# Patient Record
Sex: Male | Born: 1965
Health system: Southern US, Community
[De-identification: ages and names within clinical notes are randomized; demographics above are authoritative.]

## PROBLEM LIST (undated history)

## (undated) DIAGNOSIS — E271 Primary adrenocortical insufficiency: Secondary | ICD-10-CM

## (undated) DIAGNOSIS — E785 Hyperlipidemia, unspecified: Secondary | ICD-10-CM

## (undated) HISTORY — DX: Hyperlipidemia, unspecified: E78.5

## (undated) HISTORY — DX: Primary adrenocortical insufficiency: E27.1

## (undated) HISTORY — PX: HERNIA REPAIR: SHX51

---

## 2004-05-04 ENCOUNTER — Ambulatory Visit (HOSPITAL_COMMUNITY): Admission: RE | Admit: 2004-05-04 | Discharge: 2004-05-04 | Payer: Self-pay | Admitting: Family Medicine

## 2015-08-14 DIAGNOSIS — D582 Other hemoglobinopathies: Secondary | ICD-10-CM | POA: Diagnosis not present

## 2016-06-03 DIAGNOSIS — Z1389 Encounter for screening for other disorder: Secondary | ICD-10-CM | POA: Diagnosis not present

## 2016-06-03 DIAGNOSIS — R7309 Other abnormal glucose: Secondary | ICD-10-CM | POA: Diagnosis not present

## 2016-06-03 DIAGNOSIS — E039 Hypothyroidism, unspecified: Secondary | ICD-10-CM | POA: Diagnosis not present

## 2016-06-03 DIAGNOSIS — R030 Elevated blood-pressure reading, without diagnosis of hypertension: Secondary | ICD-10-CM | POA: Diagnosis not present

## 2016-06-03 DIAGNOSIS — E782 Mixed hyperlipidemia: Secondary | ICD-10-CM | POA: Diagnosis not present

## 2016-06-08 ENCOUNTER — Encounter (INDEPENDENT_AMBULATORY_CARE_PROVIDER_SITE_OTHER): Payer: Self-pay | Admitting: *Deleted

## 2016-08-03 DIAGNOSIS — B349 Viral infection, unspecified: Secondary | ICD-10-CM | POA: Diagnosis not present

## 2016-08-03 DIAGNOSIS — K529 Noninfective gastroenteritis and colitis, unspecified: Secondary | ICD-10-CM | POA: Diagnosis not present

## 2016-08-03 DIAGNOSIS — B9789 Other viral agents as the cause of diseases classified elsewhere: Secondary | ICD-10-CM | POA: Diagnosis not present

## 2016-08-03 DIAGNOSIS — Z6841 Body Mass Index (BMI) 40.0 and over, adult: Secondary | ICD-10-CM | POA: Diagnosis not present

## 2016-09-30 DIAGNOSIS — E039 Hypothyroidism, unspecified: Secondary | ICD-10-CM | POA: Diagnosis not present

## 2017-07-07 DIAGNOSIS — E271 Primary adrenocortical insufficiency: Secondary | ICD-10-CM | POA: Diagnosis not present

## 2017-07-07 DIAGNOSIS — R7309 Other abnormal glucose: Secondary | ICD-10-CM | POA: Diagnosis not present

## 2017-07-07 DIAGNOSIS — Z1389 Encounter for screening for other disorder: Secondary | ICD-10-CM | POA: Diagnosis not present

## 2017-07-07 DIAGNOSIS — Z6838 Body mass index (BMI) 38.0-38.9, adult: Secondary | ICD-10-CM | POA: Diagnosis not present

## 2017-07-07 DIAGNOSIS — R030 Elevated blood-pressure reading, without diagnosis of hypertension: Secondary | ICD-10-CM | POA: Diagnosis not present

## 2017-07-07 DIAGNOSIS — E039 Hypothyroidism, unspecified: Secondary | ICD-10-CM | POA: Diagnosis not present

## 2017-07-07 DIAGNOSIS — E782 Mixed hyperlipidemia: Secondary | ICD-10-CM | POA: Diagnosis not present

## 2017-07-07 LAB — LIPID PANEL
Cholesterol: 225 — AB (ref 0–200)
HDL: 56 (ref 35–70)
LDL Cholesterol: 118
Triglycerides: 253 — AB (ref 40–160)

## 2017-07-07 LAB — HEMOGLOBIN A1C: HEMOGLOBIN A1C: 11.8

## 2017-07-07 LAB — BASIC METABOLIC PANEL
BUN: 11 (ref 4–21)
CREATININE: 0.7 (ref <=1.3)

## 2017-07-07 LAB — PSA: PSA: 1.1

## 2017-07-07 LAB — TSH: TSH: 1.7 (ref <=5.90)

## 2017-07-12 ENCOUNTER — Encounter (INDEPENDENT_AMBULATORY_CARE_PROVIDER_SITE_OTHER): Payer: Self-pay | Admitting: *Deleted

## 2017-08-24 ENCOUNTER — Ambulatory Visit: Payer: BLUE CROSS/BLUE SHIELD | Admitting: "Endocrinology

## 2017-08-24 ENCOUNTER — Other Ambulatory Visit: Payer: Self-pay

## 2017-08-24 ENCOUNTER — Encounter: Payer: Self-pay | Admitting: "Endocrinology

## 2017-08-24 VITALS — BP 148/94 | HR 77 | Ht 70.0 in | Wt 264.0 lb

## 2017-08-24 DIAGNOSIS — F17200 Nicotine dependence, unspecified, uncomplicated: Secondary | ICD-10-CM | POA: Diagnosis not present

## 2017-08-24 DIAGNOSIS — E2740 Unspecified adrenocortical insufficiency: Secondary | ICD-10-CM | POA: Diagnosis not present

## 2017-08-24 DIAGNOSIS — E1165 Type 2 diabetes mellitus with hyperglycemia: Secondary | ICD-10-CM | POA: Diagnosis not present

## 2017-08-24 DIAGNOSIS — I10 Essential (primary) hypertension: Secondary | ICD-10-CM | POA: Insufficient documentation

## 2017-08-24 DIAGNOSIS — E782 Mixed hyperlipidemia: Secondary | ICD-10-CM | POA: Diagnosis not present

## 2017-08-24 DIAGNOSIS — E039 Hypothyroidism, unspecified: Secondary | ICD-10-CM | POA: Insufficient documentation

## 2017-08-24 MED ORDER — GLUCOSE BLOOD VI STRP: ORAL_STRIP | 2 refills | Status: DC

## 2017-08-24 MED ORDER — ONETOUCH VERIO W/DEVICE KIT: 1.0000 | PACK | 0 refills | Status: DC | PRN

## 2017-08-24 MED ORDER — LANCETS ULTRA FINE MISC: 1.0000 | Freq: Four times a day (QID) | 2 refills | Status: DC

## 2017-08-24 MED ORDER — ONETOUCH VERIO W/DEVICE KIT: 1.0000 | PACK | Freq: Four times a day (QID) | 0 refills | Status: DC

## 2017-08-24 MED ORDER — METFORMIN HCL 500 MG PO TABS
500.0000 mg | ORAL_TABLET | Freq: Two times a day (BID) | ORAL | 2 refills | Status: DC
Start: 2017-08-24 — End: 2017-09-04

## 2017-08-24 NOTE — Progress Notes (Signed)
Endocrinology Consult Note       08/24/2017, 8:30 PM   Subjective:    Patient ID: Jeffery Daniels, male    DOB: [birth year].  Jeffery Daniels is being seen in consultation for management of currently uncontrolled symptomatic diabetes requested by  Jeffery Sites, MD.   Past Medical History:  Diagnosis Date  . Addison disease ([place])   . Hyperlipidemia    Past Surgical History:  Procedure Laterality Date  . HERNIA REPAIR     Social History   Socioeconomic History  . Marital status: Single    Spouse name: Not on file  . Number of children: Not on file  . Years of education: Not on file  . Highest education level: Not on file  Occupational History  . Not on file  Social Needs  . Financial resource strain: Not on file  . Food insecurity:    Worry: Not on file    Inability: Not on file  . Transportation needs:    Medical: Not on file    Non-medical: Not on file  Tobacco Use  . Smoking status: Current Every Day Smoker    Packs/day: 1.50    Years: 25.00    Pack years: 37.50    Types: Cigarettes  . Smokeless tobacco: Never Used  Substance and Sexual Activity  . Alcohol use: Yes    Comment: Occasional  . Drug use: Never  . Sexual activity: Never  Lifestyle  . Physical activity:    Days per week: Not on file    Minutes per session: Not on file  . Stress: Not on file  Relationships  . Social connections:    Talks on phone: Not on file    Gets together: Not on file    Attends religious service: Not on file    Active member of club or organization: Not on file    Attends meetings of clubs or organizations: Not on file    Relationship status: Not on file  Other Topics Concern  . Not on file  Social History Narrative  . Not on file   Outpatient Encounter Medications as of 08/24/2017  Medication Sig  . hydrocortisone (CORTEF) 20 MG tablet Take 20 mg by mouth 2 (two) times daily.  .  simvastatin (ZOCOR) 20 MG tablet Take 20 mg by mouth daily.  . fludrocortisone (FLORINEF) 0.1 MG tablet Take 100 mcg by mouth daily.  Marland Kitchen levothyroxine (SYNTHROID, LEVOTHROID) 200 MCG tablet daily.  Marland Kitchen levothyroxine (SYNTHROID, LEVOTHROID) 25 MCG tablet daily.  . metFORMIN (GLUCOPHAGE) 500 MG tablet Take 1 tablet (500 mg total) by mouth 2 (two) times daily with a meal.  . [DISCONTINUED] Blood Glucose Monitoring Suppl (ONETOUCH VERIO) w/Device KIT 1 each by Does not apply route as needed.  . [DISCONTINUED] glucose blood (ONETOUCH VERIO) test strip Use as instructed   No facility-administered encounter medications on file as of 08/24/2017.     ALLERGIES: No Known Allergies  VACCINATION STATUS:  There is no immunization history on file for this patient.  Diabetes  He presents for his initial diabetic visit. He has type 2 diabetes mellitus. Onset time: He diagnosed  at age of 37 years. His disease course has been worsening. There are no hypoglycemic associated symptoms. Pertinent negatives for hypoglycemia include no headaches, seizures or tremors. Associated symptoms include blurred vision, polydipsia, polyuria and weight loss. Pertinent negatives for diabetes include no chest pain. There are no hypoglycemic complications. Symptoms are worsening. There are no diabetic complications. Risk factors for coronary artery disease include diabetes mellitus, dyslipidemia, male sex, obesity, hypertension, sedentary lifestyle, tobacco exposure and family history. When asked about current treatments, none were reported. His weight is decreasing steadily. He is following a generally unhealthy diet. When asked about meal planning, he reported none. He has not had a previous visit with a dietitian. He rarely participates in exercise. (He is not monitoring BG, his recent a1c was 11.8%.) An ACE inhibitor/angiotensin II receptor blocker is not being taken. He does not see a podiatrist.Eye exam is not current.   Hyperlipidemia  This is a chronic problem. The current episode started more than 1 year ago. The problem is uncontrolled. Exacerbating diseases include diabetes, hypothyroidism and obesity. Factors aggravating his hyperlipidemia include smoking. Pertinent negatives include no chest pain, myalgias or shortness of breath. Risk factors for coronary artery disease include dyslipidemia, diabetes mellitus, family history, obesity, male sex, hypertension and a sedentary lifestyle.  Hypertension  This is a chronic problem. The current episode started more than 1 year ago. The problem is unchanged. Associated symptoms include blurred vision. Pertinent negatives include no chest pain, headaches, palpitations or shortness of breath. Risk factors for coronary artery disease include diabetes mellitus, dyslipidemia, family history, obesity, male gender, sedentary lifestyle and smoking/tobacco exposure. Past treatments include nothing.      Review of Systems  Constitutional: Positive for weight loss. Negative for chills and fever.  Eyes: Positive for blurred vision.  Respiratory: Negative for cough and shortness of breath.   Cardiovascular: Negative for chest pain and palpitations.       No Shortness of breath  Gastrointestinal: Negative for abdominal pain, diarrhea, nausea and vomiting.  Endocrine: Positive for polydipsia and polyuria.  Genitourinary: Negative for frequency, hematuria and urgency.  Musculoskeletal: Negative for myalgias.  Skin: Negative for rash.  Neurological: Negative for tremors, seizures and headaches.  Hematological: Does not bruise/bleed easily.  Psychiatric/Behavioral: Negative for hallucinations and suicidal ideas.    Objective:    BP (!) 148/94   Pulse 77   Ht _0  (1.778 m)   Wt 264 lb (119.7 kg)   BMI 37.88 kg/m   Wt Readings from Last 3 Encounters:  08/24/17 264 lb (119.7 kg)     Physical Exam  Constitutional: He is oriented to person, place, and time. He  appears well-developed. He is cooperative. No distress.  HENT:  Head: Normocephalic and atraumatic.  Eyes: EOM are normal.  Neck: Normal range of motion. Neck supple. No tracheal deviation present. No thyromegaly present.  Cardiovascular: Normal rate, S1 normal, S2 normal and normal heart sounds. Exam reveals no gallop.  No murmur heard. Pulses:      Dorsalis pedis pulses are 1+ on the right side, and 1+ on the left side.       Posterior tibial pulses are 1+ on the right side, and 1+ on the left side.  Pulmonary/Chest: Breath sounds normal. No respiratory distress. He has no wheezes.  Abdominal: Soft. Bowel sounds are normal. He exhibits no distension. There is no tenderness. There is no guarding and no CVA tenderness.  Musculoskeletal: He exhibits no edema.       Right shoulder:  He exhibits no swelling and no deformity.  Neurological: He is alert and oriented to person, place, and time. He has normal strength and normal reflexes. No cranial nerve deficit or sensory deficit. Gait normal.  Skin: Skin is warm and dry. No rash noted. No cyanosis. Nails show no clubbing.  Psychiatric: He has a normal mood and affect. His speech is normal and behavior is normal. Judgment and thought content normal. Cognition and memory are normal.    Recent Results (from the past 2160 hour(s))  Hemoglobin A1c     Status: None   Collection Time: 07/07/17 12:00 AM  Result Value Ref Range   Hemoglobin A1C 71.2   Basic metabolic panel     Status: None   Collection Time: 07/07/17 12:00 AM  Result Value Ref Range   BUN 11 4 - 21   Creatinine 0.7 0.6 - 1.3  Lipid panel     Status: Abnormal   Collection Time: 07/07/17 12:00 AM  Result Value Ref Range   Triglycerides 253 (A) 40 - 160   Cholesterol 225 (A) 0 - 200   HDL 56 35 - 70   LDL Cholesterol 118   PSA     Status: None   Collection Time: 07/07/17 12:00 AM  Result Value Ref Range   PSA 1.1   TSH     Status: None   Collection Time: 07/07/17 12:00 AM   Result Value Ref Range   TSH 1.70 0.41 - 5.90        Assessment & Plan:   1. Uncontrolled type 2 diabetes mellitus with hyperglycemia   - Jeffery Daniels has currently uncontrolled symptomatic type 2 DM since  52 years of age,  with most recent A1c of 11.8%, not yet started on treatment. Recent labs reviewed.  -his diabetes is complicated by adrenal insufficiency on hydrocortisone, hypothyroidism, obesity/sedentary life, chronic heavy smoking and Jeffery Daniels remains at a high risk for more acute and chronic complications which include CAD, CVA, CKD, retinopathy, and neuropathy. These are all discussed in detail with the patient.  - I have counseled him on diet management and weight loss, by adopting a carbohydrate restricted/protein rich diet.  - Suggestion is made for him to avoid simple carbohydrates  from his diet including Cakes, Sweet Desserts, Ice Cream, Soda (diet and regular), Sweet Tea, Candies, Chips, Cookies, Store Bought Juices, Alcohol in Excess of  1-2 drinks a day, Artificial Sweeteners, and "Sugar-free" Products. This will help patient to have stable blood glucose profile and potentially avoid unintended weight gain.  - I encouraged him to switch to  unprocessed or minimally processed complex starch and increased protein intake (animal or plant source), fruits, and vegetables.  - he is advised to stick to a routine mealtimes to eat 3 meals  a day and avoid unnecessary snacks ( to snack only to correct hypoglycemia).   - he will be scheduled with Jeffery Daniels, RDN, CDE for individualized diabetes education.  - I have approached him with the following individualized plan to manage diabetes and patient agrees:   - He is treatment naive , will start monitoring of glucose 4 times a day-before meals and at bedtime, and return in 1 week with his meter and logs. - He may need insulin therapy based on his BG profile.  - I will continue start metformin 554m po BID,   therapeutically suitable for patient .  - he will be considered for incretin therapy as appropriate next visit. - Patient  specific target  A1c;  LDL, HDL, Triglycerides, and  Waist Circumference were discussed in detail.  2) BP/HTN: Uncontrolled, he is not on treatment. He will be considered for low dose Lisinopril next visit.  3) Lipids/HPL: Uncontrolled with LDL of 118. He is advised to continue Simvastatin 20 mg po qhs. Side effects are discussed with him.  4)  Weight/Diet: CDE Consult will be initiated , exercise, and detailed carbohydrates information provided.  5. Adrenal insufficiency ([place]) - The circumstances of his diagnosis 25 years ago are not available available to review. He reports Addison's Disease. He will be considered for work up with anti 21 hydroxylase antibody on subsequent visits. In the mean time , he is advised to continue on Hydrocortisone 48m po BID, fludrocortisone 0.175mpo qday. He may benefit from lower dose of hydrocortisone. Will lower to 30 mg po qday next visit.  6. Hypothyroidism, unspecified type - He took thyroid hormone for 10+ years , currently 225 mcg po qam. His recent TSH was on target 1.7. He is advised to continue . We discussed about the correct was to take his thyroid hormone daily on empty stomach half an hour before food or other medications.   7) Chronic Care/Health Maintenance:  -he  is not on  ACEI/ARB, is on  Statin medications and  is encouraged to continue to follow up with Ophthalmology, Dentist,  Podiatrist at least yearly or according to recommendations, and advised to  Quit smoking. I have recommended yearly flu vaccine and pneumonia vaccination at least every 5 years; moderate intensity exercise for up to 150 minutes weekly; and  sleep for at least 7 hours a day.  - I advised patient to maintain close follow up with Jeffery Daniels for primary care needs.  - Time spent with the patient: 45 minutes, of which >50% was spent in  obtaining information about his symptoms, reviewing his previous labs, evaluations, and treatments, counseling him about his  Currently uncontrolled type 2 DM, hypothyroidism, adrenal insufficiency , HTN, HPL , chronic heavy smoking,  and developing a plan to confirm the diagnosis and long term treatment as necessary.  PaKathrin Pennerarticipated in the discussions, expressed understanding, and voiced agreement with the above plans.  All questions were answered to his satisfaction. he is encouraged to contact clinic should he have any questions or concerns prior to his return visit.  Follow up plan: - Return in about 1 week (around 08/31/2017) for follow up with meter and logs- no labs.  GeGlade LloydMD CoBon[facility] Hospitalroup ReH[facility] Hospital[address] StreeteS[place] [address]hone: [phone]: [phone]  08/24/2017, 8:30 PM  This note was partially dictated with voice recognition software. Similar sounding words can be transcribed inadequately or may not  be corrected upon review.

## 2017-08-24 NOTE — Patient Instructions (Signed)

## 2017-09-04 ENCOUNTER — Encounter: Payer: Self-pay | Admitting: "Endocrinology

## 2017-09-04 ENCOUNTER — Ambulatory Visit (INDEPENDENT_AMBULATORY_CARE_PROVIDER_SITE_OTHER): Payer: BLUE CROSS/BLUE SHIELD | Admitting: "Endocrinology

## 2017-09-04 VITALS — BP 150/84 | HR 74 | Ht 70.0 in | Wt 270.0 lb

## 2017-09-04 DIAGNOSIS — E2740 Unspecified adrenocortical insufficiency: Secondary | ICD-10-CM

## 2017-09-04 DIAGNOSIS — E039 Hypothyroidism, unspecified: Secondary | ICD-10-CM | POA: Diagnosis not present

## 2017-09-04 DIAGNOSIS — E782 Mixed hyperlipidemia: Secondary | ICD-10-CM | POA: Diagnosis not present

## 2017-09-04 DIAGNOSIS — E1165 Type 2 diabetes mellitus with hyperglycemia: Secondary | ICD-10-CM

## 2017-09-04 MED ORDER — METFORMIN HCL 500 MG PO TABS: 1000.0000 mg | ORAL_TABLET | Freq: Two times a day (BID) | ORAL | 2 refills | Status: DC

## 2017-09-04 NOTE — Progress Notes (Signed)
Endocrinology Consult Note       09/04/2017, 4:55 PM   Subjective:    Patient ID: Jeffery Daniels, male    DOB: [birth year].  Jeffery Daniels is being seen in consultation for management of currently uncontrolled symptomatic diabetes requested by  Sharilyn Sites, MD.   Past Medical History:  Diagnosis Date  . Addison disease ([place])   . Hyperlipidemia    Past Surgical History:  Procedure Laterality Date  . HERNIA REPAIR     Social History   Socioeconomic History  . Marital status: Single    Spouse name: Not on file  . Number of children: Not on file  . Years of education: Not on file  . Highest education level: Not on file  Occupational History  . Not on file  Social Needs  . Financial resource strain: Not on file  . Food insecurity:    Worry: Not on file    Inability: Not on file  . Transportation needs:    Medical: Not on file    Non-medical: Not on file  Tobacco Use  . Smoking status: Current Every Day Smoker    Packs/day: 1.50    Years: 25.00    Pack years: 37.50    Types: Cigarettes  . Smokeless tobacco: Never Used  Substance and Sexual Activity  . Alcohol use: Yes    Comment: Occasional  . Drug use: Never  . Sexual activity: Never  Lifestyle  . Physical activity:    Days per week: Not on file    Minutes per session: Not on file  . Stress: Not on file  Relationships  . Social connections:    Talks on phone: Not on file    Gets together: Not on file    Attends religious service: Not on file    Active member of club or organization: Not on file    Attends meetings of clubs or organizations: Not on file    Relationship status: Not on file  Other Topics Concern  . Not on file  Social History Narrative  . Not on file   Outpatient Encounter Medications as of 09/04/2017  Medication Sig  . fludrocortisone (FLORINEF) 0.1 MG tablet Take 100 mcg by mouth daily.  Marland Kitchen glucose blood  (ONETOUCH VERIO) test strip Use as instructed  . hydrocortisone (CORTEF) 20 MG tablet Take 10-20 mg by mouth 2 (two) times daily.  Marland Kitchen LANCETS ULTRA FINE MISC 1 each by Does not apply route 4 (four) times daily.  Marland Kitchen levothyroxine (SYNTHROID, LEVOTHROID) 200 MCG tablet daily.  Marland Kitchen levothyroxine (SYNTHROID, LEVOTHROID) 25 MCG tablet daily.  . metFORMIN (GLUCOPHAGE) 500 MG tablet Take 2 tablets (1,000 mg total) by mouth 2 (two) times daily with a meal.  . simvastatin (ZOCOR) 20 MG tablet Take 20 mg by mouth daily.  . [DISCONTINUED] Blood Glucose Monitoring Suppl (ONETOUCH VERIO) w/Device KIT 1 each by Does not apply route 4 (four) times daily.  . [DISCONTINUED] metFORMIN (GLUCOPHAGE) 500 MG tablet Take 1 tablet (500 mg total) by mouth 2 (two) times daily with a meal.   No facility-administered encounter medications on file as of 09/04/2017.  ALLERGIES: No Known Allergies  VACCINATION STATUS:  There is no immunization history on file for this patient.  Diabetes  He presents for his follow-up diabetic visit. He has type 2 diabetes mellitus. Onset time: He diagnosed at age of 52 years. His disease course has been improving. There are no hypoglycemic associated symptoms. Pertinent negatives for hypoglycemia include no headaches, seizures or tremors. Pertinent negatives for diabetes include no blurred vision, no chest pain, no foot ulcerations, no polydipsia, no polyuria and no weight loss. There are no hypoglycemic complications. Symptoms are improving. There are no diabetic complications. Risk factors for coronary artery disease include diabetes mellitus, dyslipidemia, male sex, obesity, hypertension, sedentary lifestyle, tobacco exposure and family history. When asked about current treatments, none were reported. His weight is increasing steadily. He is following a generally unhealthy diet. When asked about meal planning, he reported none. He has not had a previous visit with a dietitian. He rarely  participates in exercise. His overall blood glucose range is 180-200 mg/dl. (He came with improving blood glucose profile, his recent a1c was 11.8%.) An ACE inhibitor/angiotensin II receptor blocker is not being taken. He does not see a podiatrist.Eye exam is not current.  Hyperlipidemia  This is a chronic problem. The current episode started more than 1 year ago. The problem is uncontrolled. Exacerbating diseases include diabetes, hypothyroidism and obesity. Factors aggravating his hyperlipidemia include smoking. Pertinent negatives include no chest pain, myalgias or shortness of breath. Risk factors for coronary artery disease include dyslipidemia, diabetes mellitus, family history, obesity, male sex, hypertension and a sedentary lifestyle.  Hypertension  This is a chronic problem. The current episode started more than 1 year ago. The problem is unchanged. Pertinent negatives include no blurred vision, chest pain, headaches, palpitations or shortness of breath. Risk factors for coronary artery disease include diabetes mellitus, dyslipidemia, family history, obesity, male gender, sedentary lifestyle and smoking/tobacco exposure. Past treatments include nothing.     Review of Systems  Constitutional: Negative for chills, fever and weight loss.  Eyes: Negative for blurred vision.  Respiratory: Negative for cough and shortness of breath.   Cardiovascular: Negative for chest pain and palpitations.       No Shortness of breath  Gastrointestinal: Negative for abdominal pain, diarrhea, nausea and vomiting.  Endocrine: Negative for polydipsia and polyuria.  Genitourinary: Negative for frequency, hematuria and urgency.  Musculoskeletal: Negative for myalgias.  Skin: Negative for rash.  Neurological: Negative for tremors, seizures and headaches.  Hematological: Does not bruise/bleed easily.  Psychiatric/Behavioral: Negative for hallucinations and suicidal ideas.    Objective:    BP (!) 150/84    Pulse 74   Ht '5\' 10"'$  (1.778 m)   Wt 270 lb (122.5 kg)   BMI 38.74 kg/m   Wt Readings from Last 3 Encounters:  09/04/17 270 lb (122.5 kg)  08/24/17 264 lb (119.7 kg)     Physical Exam  Constitutional: He is oriented to person, place, and time. He appears well-developed. He is cooperative. No distress.  HENT:  Head: Normocephalic and atraumatic.  Eyes: EOM are normal.  Neck: Normal range of motion. Neck supple. No tracheal deviation present. No thyromegaly present.  Cardiovascular: Normal rate, S1 normal, S2 normal and normal heart sounds. Exam reveals no gallop.  No murmur heard. Pulses:      Dorsalis pedis pulses are 1+ on the right side, and 1+ on the left side.       Posterior tibial pulses are 1+ on the right side, and 1+ on  the left side.  Pulmonary/Chest: Breath sounds normal. No respiratory distress. He has no wheezes.  Abdominal: Soft. Bowel sounds are normal. He exhibits no distension. There is no tenderness. There is no guarding and no CVA tenderness.  Musculoskeletal: He exhibits no edema.       Right shoulder: He exhibits no swelling and no deformity.  Neurological: He is alert and oriented to person, place, and time. He has normal strength and normal reflexes. No cranial nerve deficit or sensory deficit. Gait normal.  Skin: Skin is warm and dry. No rash noted. No cyanosis. Nails show no clubbing.  Psychiatric: He has a normal mood and affect. His speech is normal and behavior is normal. Judgment and thought content normal. Cognition and memory are normal.    Recent Results (from the past 2160 hour(s))  Hemoglobin A1c     Status: None   Collection Time: 07/07/17 12:00 AM  Result Value Ref Range   Hemoglobin A1C 63.7   Basic metabolic panel     Status: None   Collection Time: 07/07/17 12:00 AM  Result Value Ref Range   BUN 11 4 - 21   Creatinine 0.7 0.6 - 1.3  Lipid panel     Status: Abnormal   Collection Time: 07/07/17 12:00 AM  Result Value Ref Range    Triglycerides 253 (A) 40 - 160   Cholesterol 225 (A) 0 - 200   HDL 56 35 - 70   LDL Cholesterol 118   PSA     Status: None   Collection Time: 07/07/17 12:00 AM  Result Value Ref Range   PSA 1.1   TSH     Status: None   Collection Time: 07/07/17 12:00 AM  Result Value Ref Range   TSH 1.70 0.41 - 5.90     Assessment & Plan:   1. Uncontrolled type 2 diabetes mellitus with hyperglycemia   - Jeffery Daniels has currently uncontrolled symptomatic type 2 DM since  52 years of age,  with most recent A1c of 11.8%, not yet started on treatment. -He came with improved glycemic profile on low-dose metformin 500 mg p.o. twice daily. Recent labs reviewed.  -his diabetes is complicated by adrenal insufficiency on hydrocortisone, hypothyroidism, obesity/sedentary life, chronic heavy smoking and Jeffery Daniels remains at a high risk for more acute and chronic complications which include CAD, CVA, CKD, retinopathy, and neuropathy. These are all discussed in detail with the patient.  - I have counseled him on diet management and weight loss, by adopting a carbohydrate restricted/protein rich diet.  -  Suggestion is made for him to avoid simple carbohydrates  from his diet including Cakes, Sweet Desserts / Pastries, Ice Cream, Soda (diet and regular), Sweet Tea, Candies, Chips, Cookies, Store Bought Juices, Alcohol in Excess of  1-2 drinks a day, Artificial Sweeteners, and "Sugar-free" Products. This will help patient to have stable blood glucose profile and potentially avoid unintended weight gain.  - I encouraged him to switch to  unprocessed or minimally processed complex starch and increased protein intake (animal or plant source), fruits, and vegetables.  - he is advised to stick to a routine mealtimes to eat 3 meals  a day and avoid unnecessary snacks ( to snack only to correct hypoglycemia).   - he will be scheduled with Jeffery Daniels, Jeffery Daniels, Jeffery Daniels for individualized diabetes education.  - I have  approached him with the following individualized plan to manage diabetes and patient agrees:   -Based on his response to  metformin treatment, he will not require insulin initiation at this time.     - I advised him to increase metformin to 1000 mg p.o. twice daily,  therapeutically suitable for patient .  - he will be considered for incretin therapy as appropriate next visit. - Patient specific target  A1c;  LDL, HDL, Triglycerides, and  Waist Circumference were discussed in detail.  2) BP/HTN: His blood pressure is still above target.   he is not on treatment.  He is advised to consider smoking cessation.  He will be considered for low dose Lisinopril next visit.  3) Lipids/HPL: Uncontrolled with LDL of 118. He is advised to continue Simvastatin 20 mg po qhs. Side effects are discussed with him.  4)  Weight/Diet: Jeffery Daniels Consult will be initiated , exercise, and detailed carbohydrates information provided.  5. Adrenal insufficiency ([place]) - The circumstances of his diagnosis 25 years ago are not available available to review. He reports Addison's Disease. He will be considered for work up with anti 21 hydroxylase antibody . -I advised him to lower hydrocortisone to 30 mg total daily dose (20 mg every morning and 10 mg every noon).  I advised him to continue fludrocortisone 0.'1mg'$  po qday.   6. Hypothyroidism, unspecified type - He took thyroid hormone for 10+ years , currently 225 mcg po qam. His recent TSH was on target 1.7. He is advised to continue .    - We discussed about correct intake of levothyroxine, at fasting, with water, separated by at least 30 minutes from breakfast, and separated by more than 4 hours from calcium, iron, multivitamins, acid reflux medications (PPIs). -Patient is made aware of the fact that thyroid hormone replacement is needed for life, dose to be adjusted by periodic monitoring of thyroid function tests.     7) Chronic Care/Health Maintenance:  -he  is not on   ACEI/ARB, is on  Statin medications and  is encouraged to continue to follow up with Ophthalmology, Dentist,  Podiatrist at least yearly or according to recommendations, and advised to  Quit smoking. I have recommended yearly flu vaccine and pneumonia vaccination at least every 5 years; moderate intensity exercise for up to 150 minutes weekly; and  sleep for at least 7 hours a day.  - I advised patient to maintain close follow up with Sharilyn Sites, MD for primary care needs. - Time spent with the patient: 25 min, of which >50% was spent in reviewing his blood glucose logs , discussing his hypo- and hyper-glycemic episodes, reviewing his current and  previous labs and insulin doses and developing a plan to avoid hypo- and hyper-glycemia. Please refer to Patient Instructions for Blood Glucose Monitoring and Insulin/Medications Dosing Guide"  in media tab for additional information. Jeffery Daniels participated in the discussions, expressed understanding, and voiced agreement with the above plans.  All questions were answered to his satisfaction. he is encouraged to contact clinic should he have any questions or concerns prior to his return visit.  Follow up plan: - Return in about 6 weeks (around 10/16/2017) for follow up with pre-visit labs.  Glade Lloyd, MD [facility] [address] [place], [place] [address] Phone: [phone]  Fax: [phone]    09/04/2017, 4:55 PM  This note was partially dictated with voice recognition software. Similar sounding words can be transcribed inadequately or may not  be corrected upon review.

## 2017-09-04 NOTE — Patient Instructions (Signed)

## 2017-09-05 ENCOUNTER — Other Ambulatory Visit: Payer: Self-pay

## 2017-09-05 MED ORDER — METFORMIN HCL 500 MG PO TABS: 1000.0000 mg | ORAL_TABLET | Freq: Two times a day (BID) | ORAL | 3 refills | Status: DC

## 2017-09-29 ENCOUNTER — Other Ambulatory Visit: Payer: Self-pay | Admitting: "Endocrinology

## 2017-09-29 DIAGNOSIS — E039 Hypothyroidism, unspecified: Secondary | ICD-10-CM | POA: Diagnosis not present

## 2017-09-29 DIAGNOSIS — E782 Mixed hyperlipidemia: Secondary | ICD-10-CM | POA: Diagnosis not present

## 2017-09-29 DIAGNOSIS — E1165 Type 2 diabetes mellitus with hyperglycemia: Secondary | ICD-10-CM | POA: Diagnosis not present

## 2017-09-30 LAB — COMPREHENSIVE METABOLIC PANEL
ALK PHOS: 63 IU/L (ref 39–117)
ALT: 31 IU/L (ref 0–44)
AST: 21 IU/L (ref 0–40)
Albumin/Globulin Ratio: 2.2 (ref 1.2–2.2)
Albumin: 4.4 g/dL (ref 3.5–5.5)
BUN/Creatinine Ratio: 17 (ref 9–20)
BUN: 14 mg/dL (ref 6–24)
Bilirubin Total: 0.4 mg/dL (ref 0.0–1.2)
CO2: 25 mmol/L (ref 20–29)
CREATININE: 0.81 mg/dL (ref 0.76–1.27)
Calcium: 9.5 mg/dL (ref 8.7–10.2)
Chloride: 101 mmol/L (ref 96–106)
GFR calc Af Amer: 118 mL/min/{1.73_m2} (ref >59)
GFR calc non Af Amer: 102 mL/min/{1.73_m2} (ref >59)
GLUCOSE: 129 mg/dL — AB (ref 65–99)
Globulin, Total: 2 g/dL (ref 1.5–4.5)
Potassium: 4.5 mmol/L (ref 3.5–5.2)
Sodium: 143 mmol/L (ref 134–144)
Total Protein: 6.4 g/dL (ref 6.0–8.5)

## 2017-09-30 LAB — LIPID PANEL W/O CHOL/HDL RATIO
CHOLESTEROL TOTAL: 176 mg/dL (ref 100–199)
HDL: 52 mg/dL (ref >39)
LDL CALC: 83 mg/dL (ref 0–99)
TRIGLYCERIDES: 205 mg/dL — AB (ref 0–149)
VLDL Cholesterol Cal: 41 mg/dL — ABNORMAL HIGH (ref 5–40)

## 2017-09-30 LAB — T4, FREE: Free T4: 1.33 ng/dL (ref 0.82–1.77)

## 2017-09-30 LAB — SPECIMEN STATUS REPORT

## 2017-09-30 LAB — MICROALBUMIN, URINE: MICROALBUM., U, RANDOM: 12.4 ug/mL

## 2017-09-30 LAB — HGB A1C W/O EAG: Hgb A1c MFr Bld: 8.9 % — ABNORMAL HIGH (ref 4.8–5.6)

## 2017-09-30 LAB — TSH: TSH: 1.83 u[IU]/mL (ref 0.450–4.500)

## 2017-10-02 ENCOUNTER — Ambulatory Visit (INDEPENDENT_AMBULATORY_CARE_PROVIDER_SITE_OTHER): Payer: BLUE CROSS/BLUE SHIELD | Admitting: "Endocrinology

## 2017-10-02 ENCOUNTER — Encounter: Payer: Self-pay | Admitting: "Endocrinology

## 2017-10-02 ENCOUNTER — Encounter: Payer: BLUE CROSS/BLUE SHIELD | Attending: Family Medicine | Admitting: Nutrition

## 2017-10-02 VITALS — Ht 71.0 in | Wt 266.8 lb

## 2017-10-02 VITALS — BP 138/84 | HR 86 | Ht 71.0 in | Wt 266.0 lb

## 2017-10-02 DIAGNOSIS — Z713 Dietary counseling and surveillance: Secondary | ICD-10-CM | POA: Insufficient documentation

## 2017-10-02 DIAGNOSIS — E782 Mixed hyperlipidemia: Secondary | ICD-10-CM

## 2017-10-02 DIAGNOSIS — Z6837 Body mass index (BMI) 37.0-37.9, adult: Secondary | ICD-10-CM | POA: Diagnosis not present

## 2017-10-02 DIAGNOSIS — I10 Essential (primary) hypertension: Secondary | ICD-10-CM

## 2017-10-02 DIAGNOSIS — E1165 Type 2 diabetes mellitus with hyperglycemia: Secondary | ICD-10-CM

## 2017-10-02 DIAGNOSIS — IMO0002 Reserved for concepts with insufficient information to code with codable children: Secondary | ICD-10-CM

## 2017-10-02 DIAGNOSIS — E118 Type 2 diabetes mellitus with unspecified complications: Secondary | ICD-10-CM

## 2017-10-02 DIAGNOSIS — E669 Obesity, unspecified: Secondary | ICD-10-CM

## 2017-10-02 DIAGNOSIS — E039 Hypothyroidism, unspecified: Secondary | ICD-10-CM | POA: Diagnosis not present

## 2017-10-02 DIAGNOSIS — E2740 Unspecified adrenocortical insufficiency: Secondary | ICD-10-CM

## 2017-10-02 MED ORDER — HYDROCORTISONE 10 MG PO TABS: ORAL_TABLET | ORAL | 2 refills | Status: DC

## 2017-10-02 MED ORDER — LEVOTHYROXINE SODIUM 200 MCG PO TABS
200.0000 ug | ORAL_TABLET | Freq: Every day | ORAL | 1 refills | Status: DC
Start: 2017-10-02 — End: 2018-04-10

## 2017-10-02 MED ORDER — LEVOTHYROXINE SODIUM 25 MCG PO TABS: 25.0000 ug | ORAL_TABLET | Freq: Every day | ORAL | 1 refills | Status: DC

## 2017-10-02 MED ORDER — FLUDROCORTISONE ACETATE 0.1 MG PO TABS: 100.0000 ug | ORAL_TABLET | Freq: Every day | ORAL | 1 refills | Status: DC

## 2017-10-02 NOTE — Progress Notes (Signed)
Diabetes Self-Management Education  Visit Type: Follow up  Appt. Start Time: 1400 Appt. End Time: 1430  10/16/2017  Mr. Leonides Sakeaul Perrell, identified by name and date of birth, is a 52 y.o. male with a diagnosis of Diabetes: Type 2.   ASSESSMENT  Height 5\' 11"  (1.803 m), weight 266 lb 12.8 oz (121 kg). Body mass index is 37.21 kg/m.     Learning Objective:  Patient will have a greater understanding of diabetes self-management. Patient education plan is to attend individual and/or group sessions per assessed needs and concerns.   Plan:   Patient Instructions  Goals 1. Follow My Plate 2. Watch portions Eat 3-4 carb choices pe meals Increase lower carb veggies. 3. Cut out fast food breakfasts Exercise 30 minutes of walking daily Check blood sugars twice a day Keep drinking water Get A1C down to 7 or less.     Expected Outcomes:  Demonstrated interest in learning. Expect positive outcomes  Education material provided: ADA Diabetes: Your Take Control Guide, A1C conversion sheet, Meal plan card and Carbohydrate counting sheet  If problems or questions, patient to contact team via:  Phone and Email  Future DSME appointment: - 4-6 wks

## 2017-10-02 NOTE — Progress Notes (Signed)
Endocrinology follow-up note       10/02/2017, 5:29 PM   Subjective:    Patient ID: Jeffery Daniels, male    DOB: [birth year].  Jeffery Daniels is being seen in follow-up for management of currently uncontrolled symptomatic diabetes requested by  Jeffery Found, MD.   Past Medical History:  Diagnosis Date  . Addison disease (HCC)   . Hyperlipidemia    Past Surgical History:  Procedure Laterality Date  . HERNIA REPAIR     Social History   Socioeconomic History  . Marital status: Single    Spouse name: Not on file  . Number of children: Not on file  . Years of education: Not on file  . Highest education level: Not on file  Occupational History  . Not on file  Social Needs  . Financial resource strain: Not on file  . Food insecurity:    Worry: Not on file    Inability: Not on file  . Transportation needs:    Medical: Not on file    Non-medical: Not on file  Tobacco Use  . Smoking status: Current Every Day Smoker    Packs/day: 1.50    Years: 25.00    Pack years: 37.50    Types: Cigarettes  . Smokeless tobacco: Never Used  Substance and Sexual Activity  . Alcohol use: Yes    Comment: Occasional  . Drug use: Never  . Sexual activity: Never  Lifestyle  . Physical activity:    Days per week: Not on file    Minutes per session: Not on file  . Stress: Not on file  Relationships  . Social connections:    Talks on phone: Not on file    Gets together: Not on file    Attends religious service: Not on file    Active member of club or organization: Not on file    Attends meetings of clubs or organizations: Not on file    Relationship status: Not on file  Other Topics Concern  . Not on file  Social History Narrative  . Not on file   Outpatient Encounter Medications as of 10/02/2017  Medication Sig  . fludrocortisone (FLORINEF) 0.1 MG tablet Take 1 tablet (100 mcg total) by mouth daily.  Marland Kitchen  glucose blood (ONETOUCH VERIO) test strip Use as instructed  . hydrocortisone (CORTEF) 10 MG tablet Take 15 mg AM and 10 mg every Noon  . LANCETS ULTRA FINE MISC 1 each by Does not apply route 4 (four) times daily.  Marland Kitchen levothyroxine (SYNTHROID, LEVOTHROID) 200 MCG tablet Take 1 tablet (200 mcg total) by mouth daily before breakfast.  . levothyroxine (SYNTHROID, LEVOTHROID) 25 MCG tablet Take 1 tablet (25 mcg total) by mouth daily before breakfast.  . metFORMIN (GLUCOPHAGE) 500 MG tablet Take 2 tablets (1,000 mg total) by mouth 2 (two) times daily with a meal.  . simvastatin (ZOCOR) 20 MG tablet Take 20 mg by mouth daily.  . [DISCONTINUED] fludrocortisone (FLORINEF) 0.1 MG tablet Take 100 mcg by mouth daily.  . [DISCONTINUED] hydrocortisone (CORTEF) 20 MG tablet Take 10-20 mg by mouth 2 (two) times daily.  . [DISCONTINUED]  levothyroxine (SYNTHROID, LEVOTHROID) 200 MCG tablet daily.  . [DISCONTINUED] levothyroxine (SYNTHROID, LEVOTHROID) 25 MCG tablet daily.   No facility-administered encounter medications on file as of 10/02/2017.     ALLERGIES: No Known Allergies  VACCINATION STATUS:  There is no immunization history on file for this patient.  Diabetes  He presents for his follow-up diabetic visit. He has type 2 diabetes mellitus. Onset time: He diagnosed at age of 52 years. His disease course has been improving. There are no hypoglycemic associated symptoms. Pertinent negatives for hypoglycemia include no headaches, seizures or tremors. Pertinent negatives for diabetes include no blurred vision, no chest pain, no foot ulcerations, no polydipsia, no polyuria and no weight loss. There are no hypoglycemic complications. Symptoms are improving. There are no diabetic complications. Risk factors for coronary artery disease include diabetes mellitus, dyslipidemia, male sex, obesity, hypertension, sedentary lifestyle, tobacco exposure and family history. When asked about current treatments, none were  reported. His weight is decreasing steadily. He is following a generally unhealthy diet. When asked about meal planning, he reported none. He has not had a previous visit with a dietitian. He rarely participates in exercise. (He came with improved A1c of 8.9% from 11.8%.  ) An ACE inhibitor/angiotensin II receptor blocker is not being taken. He does not see a podiatrist.Eye exam is not current.  Hyperlipidemia  This is a chronic problem. The current episode started more than 1 year ago. The problem is uncontrolled. Exacerbating diseases include diabetes, hypothyroidism and obesity. Factors aggravating his hyperlipidemia include smoking. Pertinent negatives include no chest pain, myalgias or shortness of breath. Risk factors for coronary artery disease include dyslipidemia, diabetes mellitus, family history, obesity, male sex, hypertension and a sedentary lifestyle.  Hypertension  This is a chronic problem. The current episode started more than 1 year ago. The problem is unchanged. Pertinent negatives include no blurred vision, chest pain, headaches, palpitations or shortness of breath. Risk factors for coronary artery disease include diabetes mellitus, dyslipidemia, family history, obesity, male gender, sedentary lifestyle and smoking/tobacco exposure. Past treatments include nothing.     Review of Systems  Constitutional: Negative for chills, fever and weight loss.  Eyes: Negative for blurred vision.  Respiratory: Negative for cough and shortness of breath.   Cardiovascular: Negative for chest pain and palpitations.       No Shortness of breath  Gastrointestinal: Negative for abdominal pain, diarrhea, nausea and vomiting.  Endocrine: Negative for polydipsia and polyuria.  Genitourinary: Negative for frequency, hematuria and urgency.  Musculoskeletal: Negative for myalgias.  Skin: Negative for rash.  Neurological: Negative for tremors, seizures and headaches.  Hematological: Does not  bruise/bleed easily.  Psychiatric/Behavioral: Negative for hallucinations and suicidal ideas.    Objective:    BP 138/84   Pulse 86   Ht 5\' 11"  (1.803 m)   Wt 266 lb (120.7 kg)   BMI 37.10 kg/m   Wt Readings from Last 3 Encounters:  10/02/17 266 lb (120.7 kg)  10/02/17 266 lb 12.8 oz (121 kg)  09/04/17 270 lb (122.5 kg)     Physical Exam  Constitutional: He is oriented to person, place, and time. He appears well-developed. He is cooperative. No distress.  HENT:  Head: Normocephalic and atraumatic.  Eyes: EOM are normal.  Neck: Normal range of motion. Neck supple. No tracheal deviation present. No thyromegaly present.  Cardiovascular: Normal rate, S1 normal, S2 normal and normal heart sounds. Exam reveals no gallop.  No murmur heard. Pulses:      Dorsalis pedis pulses  are 1+ on the right side, and 1+ on the left side.       Posterior tibial pulses are 1+ on the right side, and 1+ on the left side.  Pulmonary/Chest: Breath sounds normal. No respiratory distress. He has no wheezes.  Abdominal: Soft. Bowel sounds are normal. He exhibits no distension. There is no tenderness. There is no guarding and no CVA tenderness.  Musculoskeletal: He exhibits no edema.       Right shoulder: He exhibits no swelling and no deformity.  Neurological: He is alert and oriented to person, place, and time. He has normal strength and normal reflexes. No cranial nerve deficit or sensory deficit. Gait normal.  Skin: Skin is warm and dry. No rash noted. No cyanosis. Nails show no clubbing.  Psychiatric: He has a normal mood and affect. His speech is normal and behavior is normal. Judgment and thought content normal. Cognition and memory are normal.    Recent Results (from the past 2160 hour(s))  Hemoglobin A1c     Status: None   Collection Time: 07/07/17 12:00 AM  Result Value Ref Range   Hemoglobin A1C 11.8   Basic metabolic panel     Status: None   Collection Time: 07/07/17 12:00 AM  Result Value  Ref Range   BUN 11 4 - 21   Creatinine 0.7 0.6 - 1.3  Lipid panel     Status: Abnormal   Collection Time: 07/07/17 12:00 AM  Result Value Ref Range   Triglycerides 253 (A) 40 - 160   Cholesterol 225 (A) 0 - 200   HDL 56 35 - 70   LDL Cholesterol 118   PSA     Status: None   Collection Time: 07/07/17 12:00 AM  Result Value Ref Range   PSA 1.1   TSH     Status: None   Collection Time: 07/07/17 12:00 AM  Result Value Ref Range   TSH 1.70 0.41 - 5.90  Comprehensive metabolic panel     Status: Abnormal   Collection Time: 09/29/17  9:10 AM  Result Value Ref Range   Glucose 129 (H) 65 - 99 mg/dL   BUN 14 6 - 24 mg/dL   Creatinine, Ser 1.610.81 0.76 - 1.27 mg/dL   GFR calc non Af Amer 102 >59 mL/min/1.73   GFR calc Af Amer 118 >59 mL/min/1.73   BUN/Creatinine Ratio 17 9 - 20   Sodium 143 134 - 144 mmol/L   Potassium 4.5 3.5 - 5.2 mmol/L   Chloride 101 96 - 106 mmol/L   CO2 25 20 - 29 mmol/L   Calcium 9.5 8.7 - 10.2 mg/dL   Total Protein 6.4 6.0 - 8.5 g/dL   Albumin 4.4 3.5 - 5.5 g/dL   Globulin, Total 2.0 1.5 - 4.5 g/dL   Albumin/Globulin Ratio 2.2 1.2 - 2.2   Bilirubin Total 0.4 0.0 - 1.2 mg/dL   Alkaline Phosphatase 63 39 - 117 IU/L   AST 21 0 - 40 IU/L   ALT 31 0 - 44 IU/L  Lipid Panel w/o Chol/HDL Ratio     Status: Abnormal   Collection Time: 09/29/17  9:10 AM  Result Value Ref Range   Cholesterol, Total 176 100 - 199 mg/dL   Triglycerides 096205 (H) 0 - 149 mg/dL   HDL 52 >04>39 mg/dL   VLDL Cholesterol Cal 41 (H) 5 - 40 mg/dL   LDL Calculated 83 0 - 99 mg/dL  Hgb V4UA1c w/o eAG     Status: Abnormal  Collection Time: 09/29/17  9:10 AM  Result Value Ref Range   Hgb A1c MFr Bld 8.9 (H) 4.8 - 5.6 %    Comment:          Prediabetes: 5.7 - 6.4          Diabetes: >6.4          Glycemic control for adults with diabetes: <7.0   T4, free     Status: None   Collection Time: 09/29/17  9:10 AM  Result Value Ref Range   Free T4 1.33 0.82 - 1.77 ng/dL  TSH     Status: None    Collection Time: 09/29/17  9:10 AM  Result Value Ref Range   TSH 1.830 0.450 - 4.500 uIU/mL  Microalbumin, urine     Status: None   Collection Time: 09/29/17  9:10 AM  Result Value Ref Range   Microalbumin, Urine 12.4 Not Estab. ug/mL  Specimen status report     Status: None   Collection Time: 09/29/17  9:10 AM  Result Value Ref Range   specimen status report Comment     Comment: Gloris [place] CMP14 Default Ambig Abbrev CMP14 Default A hand-written panel/profile was received from your office. In accordance with the [facility] Ambiguous Test Code Policy dated July 2003, we have completed your order by using the closest currently or formerly recognized AMA panel.  We have assigned Comprehensive Metabolic Panel (14), Test Code #[id number] to this request.  If this is not the testing you wished to receive on this specimen, please contact the [facility] Client Inquiry/Technical Services Department to clarify the test order.  We appreciate your business. Ambig Abbrev LP Default Ambig Abbrev LP Default A hand-written panel/profile was received from your office. In accordance with the [facility] Ambiguous Test Code Policy dated July 2003, we have completed your order by using the closest currently or formerly recognized AMA panel.  We have assigned Lipid Panel, Test Code [phone] to this request. If this is not the testing you wished to receive on this specimen, plea se contact the [facility] Client Inquiry/Technical Services Department to clarify the test order.  We appreciate your business.    Lipid Panel     Component Value Date/Time   CHOL 176 09/29/2017 0910   TRIG 205 (H) 09/29/2017 0910   HDL 52 09/29/2017 0910   LDLCALC 83 09/29/2017 0910     Assessment & Plan:   1. Uncontrolled type 2 diabetes mellitus with hyperglycemia   - Jeffery Daniels has currently uncontrolled symptomatic type 2 DM since  52 years of age. -He is responding to metformin treatment, improved his A1c to 8.9% from  11.8%. Recent labs reviewed.  -his diabetes is complicated by adrenal insufficiency on hydrocortisone, hypothyroidism, obesity/sedentary life, chronic heavy smoking and Jeffery Daniels remains at a high risk for more acute and chronic complications which include CAD, CVA, CKD, retinopathy, and neuropathy. These are all discussed in detail with the patient.  - I have counseled him on diet management and weight loss, by adopting a carbohydrate restricted/protein rich diet.  -  Suggestion is made for him to avoid simple carbohydrates  from his diet including Cakes, Sweet Desserts / Pastries, Ice Cream, Soda (diet and regular), Sweet Tea, Candies, Chips, Cookies, Store Bought Juices, Alcohol in Excess of  1-2 drinks a day, Artificial Sweeteners, and "Sugar-free" Products. This will help patient to have stable blood glucose profile and potentially avoid unintended weight gain.  - I encouraged him to switch to  unprocessed or minimally processed complex starch and increased protein intake (animal or plant source), fruits, and vegetables.  - he is advised to stick to a routine mealtimes to eat 3 meals  a day and avoid unnecessary snacks ( to snack only to correct hypoglycemia).   - he will be scheduled with Jeffery Daniels, Jeffery Daniels, Jeffery Daniels for individualized diabetes education.  - I have approached him with the following individualized plan to manage diabetes and patient agrees:   -Based on his response to metformin treatment, he will not require insulin initiation at this time.     - I advised him to continue metformin 1000 mg p.o. twice daily,  therapeutically suitable for patient .  - he will be considered for incretin therapy as appropriate next visit. - Patient specific target  A1c;  LDL, HDL, Triglycerides, and  Waist Circumference were discussed in detail.  2) BP/HTN: His blood pressure is controlled to target today.  Unfortunately, he continues to smoke heavily.  He is advised to consider smoking  cessation.  He will be considered for low dose Lisinopril next visit.  3) Lipids/HPL: His recent labs show improved LDL at 83, improving from 956.  He is advised to continue Simvastatin 20 mg po qhs. Side effects are discussed with him.  4)  Weight/Diet: Jeffery Daniels Consult has been  initiated , exercise, and detailed carbohydrates information provided.  5. Adrenal insufficiency (HCC) - The circumstances of his diagnosis 25 years ago are not available available to review. He reports Addison's Disease. He will be considered for work up with anti 21 hydroxylase antibody . -I advised him to lower hydrocortisone to 25 mg total daily dose (15 mg every morning and 10 mg every noon).  I advised him to continue fludrocortisone 0.1mg  po qday.   6. Hypothyroidism, unspecified type - He took thyroid hormone for 10+ years , currently 225 mcg po qam.  -His thyroid function tests are consistent with appropriate replacement.     - We discussed about correct intake of levothyroxine, at fasting, with water, separated by at least 30 minutes from breakfast, and separated by more than 4 hours from calcium, iron, multivitamins, acid reflux medications (PPIs). -Patient is made aware of the fact that thyroid hormone replacement is needed for life, dose to be adjusted by periodic monitoring of thyroid function tests.   7) Chronic Care/Health Maintenance:  -he  is not on  ACEI/ARB, is on  Statin medications and  is encouraged to continue to follow up with Ophthalmology, Dentist,  Podiatrist at least yearly or according to recommendations, and advised to  Quit smoking. I have recommended yearly flu vaccine and pneumonia vaccination at least every 5 years; moderate intensity exercise for up to 150 minutes weekly; and  sleep for at least 7 hours a day.  - I advised patient to maintain close follow up with Jeffery Found, MD for primary care needs.  - Time spent with the patient: 25 min, of which >50% was spent in reviewing his   current and  previous labs, previous treatments, and medications doses and developing a plan for long-term care.  Jeffery Daniels participated in the discussions, expressed understanding, and voiced agreement with the above plans.  All questions were answered to his satisfaction. he is encouraged to contact clinic should he have any questions or concerns prior to his return visit.   Follow up plan: - Return in about 4 months (around 02/01/2018) for follow up with pre-visit labs.  Marquis Lunch, MD [facility] [address] [place], [place] [address] Phone: [phone]  Fax: [phone]    10/02/2017, 5:29 PM  This note was partially dictated with voice recognition software. Similar sounding words can be transcribed inadequately or may not  be corrected upon review.

## 2017-10-02 NOTE — Patient Instructions (Signed)

## 2017-10-02 NOTE — Patient Instructions (Signed)
Goals 1. Follow My Plate 2. Watch portions Eat 3-4 carb choices pe meals Increase lower carb veggies. 3. Cut out fast food breakfasts Exercise 30 minutes of walking daily Check blood sugars twice a day Keep drinking water Get A1C down to 7 or less.

## 2017-10-16 ENCOUNTER — Encounter: Payer: Self-pay | Admitting: Nutrition

## 2018-01-29 ENCOUNTER — Other Ambulatory Visit: Payer: Self-pay | Admitting: "Endocrinology

## 2018-01-31 ENCOUNTER — Other Ambulatory Visit: Payer: Self-pay | Admitting: "Endocrinology

## 2018-01-31 DIAGNOSIS — E1165 Type 2 diabetes mellitus with hyperglycemia: Secondary | ICD-10-CM

## 2018-01-31 DIAGNOSIS — E039 Hypothyroidism, unspecified: Secondary | ICD-10-CM

## 2018-02-01 ENCOUNTER — Ambulatory Visit: Payer: BLUE CROSS/BLUE SHIELD | Admitting: "Endocrinology

## 2018-02-07 ENCOUNTER — Other Ambulatory Visit: Payer: Self-pay | Admitting: "Endocrinology

## 2018-02-07 DIAGNOSIS — E039 Hypothyroidism, unspecified: Secondary | ICD-10-CM | POA: Diagnosis not present

## 2018-02-07 DIAGNOSIS — E1165 Type 2 diabetes mellitus with hyperglycemia: Secondary | ICD-10-CM | POA: Diagnosis not present

## 2018-02-08 LAB — COMPREHENSIVE METABOLIC PANEL
ALK PHOS: 60 IU/L (ref 39–117)
ALT: 25 IU/L (ref 0–44)
AST: 16 IU/L (ref 0–40)
Albumin/Globulin Ratio: 2.2 (ref 1.2–2.2)
Albumin: 4.4 g/dL (ref 3.5–5.5)
BILIRUBIN TOTAL: 0.3 mg/dL (ref 0.0–1.2)
BUN/Creatinine Ratio: 19 (ref 9–20)
BUN: 16 mg/dL (ref 6–24)
CHLORIDE: 105 mmol/L (ref 96–106)
CO2: 24 mmol/L (ref 20–29)
CREATININE: 0.86 mg/dL (ref 0.76–1.27)
Calcium: 9.5 mg/dL (ref 8.7–10.2)
GFR calc Af Amer: 115 mL/min/{1.73_m2} (ref >59)
GFR calc non Af Amer: 100 mL/min/{1.73_m2} (ref >59)
GLUCOSE: 104 mg/dL — AB (ref 65–99)
Globulin, Total: 2 g/dL (ref 1.5–4.5)
Potassium: 4.6 mmol/L (ref 3.5–5.2)
Sodium: 143 mmol/L (ref 134–144)
Total Protein: 6.4 g/dL (ref 6.0–8.5)

## 2018-02-08 LAB — SPECIMEN STATUS REPORT

## 2018-02-08 LAB — HGB A1C W/O EAG: HEMOGLOBIN A1C: 5.7 % — AB (ref 4.8–5.6)

## 2018-02-08 LAB — T4, FREE: FREE T4: 1.68 ng/dL (ref 0.82–1.77)

## 2018-02-08 LAB — TSH: TSH: 0.303 u[IU]/mL — ABNORMAL LOW (ref 0.450–4.500)

## 2018-02-14 ENCOUNTER — Ambulatory Visit: Payer: BLUE CROSS/BLUE SHIELD | Admitting: "Endocrinology

## 2018-02-15 ENCOUNTER — Encounter: Payer: Self-pay | Admitting: "Endocrinology

## 2018-02-15 ENCOUNTER — Ambulatory Visit (INDEPENDENT_AMBULATORY_CARE_PROVIDER_SITE_OTHER): Payer: BLUE CROSS/BLUE SHIELD | Admitting: "Endocrinology

## 2018-02-15 VITALS — BP 141/80 | HR 76 | Ht 71.0 in | Wt 268.0 lb

## 2018-02-15 DIAGNOSIS — I10 Essential (primary) hypertension: Secondary | ICD-10-CM

## 2018-02-15 DIAGNOSIS — E782 Mixed hyperlipidemia: Secondary | ICD-10-CM

## 2018-02-15 DIAGNOSIS — E039 Hypothyroidism, unspecified: Secondary | ICD-10-CM

## 2018-02-15 DIAGNOSIS — E2740 Unspecified adrenocortical insufficiency: Secondary | ICD-10-CM | POA: Diagnosis not present

## 2018-02-15 DIAGNOSIS — E1165 Type 2 diabetes mellitus with hyperglycemia: Secondary | ICD-10-CM

## 2018-02-15 MED ORDER — METFORMIN HCL 500 MG PO TABS: ORAL_TABLET | ORAL | 3 refills | Status: DC

## 2018-02-15 MED ORDER — HYDROCHLOROTHIAZIDE 25 MG PO TABS: 25.0000 mg | ORAL_TABLET | Freq: Every day | ORAL | 2 refills | Status: DC

## 2018-02-15 NOTE — Progress Notes (Signed)
Endocrinology follow-up note       02/15/2018, 5:12 PM   Subjective:    Patient ID: Jeffery Daniels, male    DOB: January 11, 1966.  Jeffery Daniels is being seen in follow-up for management of currently uncontrolled symptomatic type 2 diabetes, adrenal insufficiency, hypothyroidism, hypertension, hyperlipidemia. PMD:   Assunta Found, MD.   Past Medical History:  Diagnosis Date  . Addison disease (HCC)   . Hyperlipidemia    Past Surgical History:  Procedure Laterality Date  . HERNIA REPAIR     Social History   Socioeconomic History  . Marital status: Single    Spouse name: Not on file  . Number of children: Not on file  . Years of education: Not on file  . Highest education level: Not on file  Occupational History  . Not on file  Social Needs  . Financial resource strain: Not on file  . Food insecurity:    Worry: Not on file    Inability: Not on file  . Transportation needs:    Medical: Not on file    Non-medical: Not on file  Tobacco Use  . Smoking status: Current Every Day Smoker    Packs/day: 1.50    Years: 25.00    Pack years: 37.50    Types: Cigarettes  . Smokeless tobacco: Never Used  Substance and Sexual Activity  . Alcohol use: Yes    Comment: Occasional  . Drug use: Never  . Sexual activity: Never  Lifestyle  . Physical activity:    Days per week: Not on file    Minutes per session: Not on file  . Stress: Not on file  Relationships  . Social connections:    Talks on phone: Not on file    Gets together: Not on file    Attends religious service: Not on file    Active member of club or organization: Not on file    Attends meetings of clubs or organizations: Not on file    Relationship status: Not on file  Other Topics Concern  . Not on file  Social History Narrative  . Not on file   Outpatient Encounter Medications as of 02/15/2018  Medication Sig  . fludrocortisone  (FLORINEF) 0.1 MG tablet Take 1 tablet (100 mcg total) by mouth daily.  Marland Kitchen glucose blood (ONETOUCH VERIO) test strip Use as instructed  . hydrochlorothiazide (HYDRODIURIL) 25 MG tablet Take 1 tablet (25 mg total) by mouth daily.  . hydrocortisone (CORTEF) 10 MG tablet Take 15 mg AM and 10 mg every Noon  . LANCETS ULTRA FINE MISC 1 each by Does not apply route 4 (four) times daily.  Marland Kitchen levothyroxine (SYNTHROID, LEVOTHROID) 200 MCG tablet Take 1 tablet (200 mcg total) by mouth daily before breakfast.  . metFORMIN (GLUCOPHAGE) 500 MG tablet TAKE ONE TABLETS BY MOUTH TWICE DAILY WITH A MEAL  . simvastatin (ZOCOR) 20 MG tablet Take 20 mg by mouth daily.  . [DISCONTINUED] levothyroxine (SYNTHROID, LEVOTHROID) 25 MCG tablet Take 1 tablet (25 mcg total) by mouth daily before breakfast.  . [DISCONTINUED] metFORMIN (GLUCOPHAGE) 500 MG tablet TAKE TWO TABLETS BY MOUTH TWICE DAILY  WITH A MEAL   No facility-administered encounter medications on file as of 02/15/2018.     ALLERGIES: No Known Allergies  VACCINATION STATUS:  There is no immunization history on file for this patient.  Diabetes  He presents for his follow-up diabetic visit. He has type 2 diabetes mellitus. Onset time: He diagnosed at age of 19 years. His disease course has been improving. There are no hypoglycemic associated symptoms. Pertinent negatives for hypoglycemia include no headaches, seizures or tremors. Pertinent negatives for diabetes include no blurred vision, no chest pain, no foot ulcerations, no polydipsia, no polyuria and no weight loss. There are no hypoglycemic complications. Symptoms are improving. There are no diabetic complications. Risk factors for coronary artery disease include diabetes mellitus, dyslipidemia, male sex, obesity, hypertension, sedentary lifestyle, tobacco exposure and family history. When asked about current treatments, none were reported. His weight is fluctuating minimally. He is following a generally  unhealthy diet. When asked about meal planning, he reported none. He has not had a previous visit with a dietitian. He rarely participates in exercise. (He came with continued improvement in his A1c to 5.7%, progressively improved from 11.8%.    ) An ACE inhibitor/angiotensin II receptor blocker is not being taken. He does not see a podiatrist.Eye exam is not current.  Hyperlipidemia  This is a chronic problem. The current episode started more than 1 year ago. The problem is uncontrolled. Exacerbating diseases include diabetes, hypothyroidism and obesity. Factors aggravating his hyperlipidemia include smoking. Pertinent negatives include no chest pain, myalgias or shortness of breath. Risk factors for coronary artery disease include dyslipidemia, diabetes mellitus, family history, obesity, male sex, hypertension and a sedentary lifestyle.  Hypertension  This is a chronic problem. The current episode started more than 1 year ago. The problem is unchanged. Pertinent negatives include no blurred vision, chest pain, headaches, palpitations or shortness of breath. Risk factors for coronary artery disease include diabetes mellitus, dyslipidemia, family history, obesity, male gender, sedentary lifestyle and smoking/tobacco exposure. Past treatments include nothing.    Review of Systems  Constitutional: Negative for chills, fever and weight loss.  Eyes: Negative for blurred vision.  Respiratory: Negative for cough and shortness of breath.   Cardiovascular: Negative for chest pain and palpitations.       No Shortness of breath  Gastrointestinal: Negative for abdominal pain, diarrhea, nausea and vomiting.  Endocrine: Negative for polydipsia and polyuria.  Genitourinary: Negative for frequency, hematuria and urgency.  Musculoskeletal: Negative for myalgias.  Skin: Negative for rash.  Neurological: Negative for tremors, seizures and headaches.  Hematological: Does not bruise/bleed easily.   Psychiatric/Behavioral: Negative for hallucinations and suicidal ideas.    Objective:    BP (!) 141/80   Pulse 76   Ht 5\' 11"  (1.803 m)   Wt 268 lb (121.6 kg)   BMI 37.38 kg/m   Wt Readings from Last 3 Encounters:  02/15/18 268 lb (121.6 kg)  10/02/17 266 lb (120.7 kg)  10/02/17 266 lb 12.8 oz (121 kg)     Physical Exam  Constitutional: He is oriented to person, place, and time. He appears well-developed. He is cooperative. No distress.  HENT:  Head: Normocephalic and atraumatic.  Eyes: EOM are normal.  Neck: Normal range of motion. Neck supple. No tracheal deviation present. No thyromegaly present.  Cardiovascular: Normal rate, S1 normal, S2 normal and normal heart sounds. Exam reveals no gallop.  No murmur heard. Pulses:      Dorsalis pedis pulses are 1+ on the right side, and  1+ on the left side.       Posterior tibial pulses are 1+ on the right side, and 1+ on the left side.  Pulmonary/Chest: Breath sounds normal. No respiratory distress. He has no wheezes.  Abdominal: Soft. Bowel sounds are normal. He exhibits no distension. There is no tenderness. There is no guarding and no CVA tenderness.  Musculoskeletal: He exhibits no edema.       Right shoulder: He exhibits no swelling and no deformity.  Neurological: He is alert and oriented to person, place, and time. He has normal strength and normal reflexes. No cranial nerve deficit or sensory deficit. Gait normal.  Skin: Skin is warm and dry. No rash noted. No cyanosis. Nails show no clubbing.  Psychiatric: He has a normal mood and affect. His speech is normal and behavior is normal. Judgment and thought content normal. Cognition and memory are normal.    Recent Results (from the past 2160 hour(s))  Comprehensive metabolic panel     Status: Abnormal   Collection Time: 02/07/18 10:23 AM  Result Value Ref Range   Glucose 104 (H) 65 - 99 mg/dL   BUN 16 6 - 24 mg/dL   Creatinine, Ser 4.09 0.76 - 1.27 mg/dL   GFR calc non Af  Amer 100 >59 mL/min/1.73   GFR calc Af Amer 115 >59 mL/min/1.73   BUN/Creatinine Ratio 19 9 - 20   Sodium 143 134 - 144 mmol/L   Potassium 4.6 3.5 - 5.2 mmol/L   Chloride 105 96 - 106 mmol/L   CO2 24 20 - 29 mmol/L   Calcium 9.5 8.7 - 10.2 mg/dL   Total Protein 6.4 6.0 - 8.5 g/dL   Albumin 4.4 3.5 - 5.5 g/dL   Globulin, Total 2.0 1.5 - 4.5 g/dL   Albumin/Globulin Ratio 2.2 1.2 - 2.2   Bilirubin Total 0.3 0.0 - 1.2 mg/dL   Alkaline Phosphatase 60 39 - 117 IU/L   AST 16 0 - 40 IU/L   ALT 25 0 - 44 IU/L  Hgb A1c w/o eAG     Status: Abnormal   Collection Time: 02/07/18 10:23 AM  Result Value Ref Range   Hgb A1c MFr Bld 5.7 (H) 4.8 - 5.6 %    Comment:          Prediabetes: 5.7 - 6.4          Diabetes: >6.4          Glycemic control for adults with diabetes: <7.0   T4, free     Status: None   Collection Time: 02/07/18 10:23 AM  Result Value Ref Range   Free T4 1.68 0.82 - 1.77 ng/dL  TSH     Status: Abnormal   Collection Time: 02/07/18 10:23 AM  Result Value Ref Range   TSH 0.303 (L) 0.450 - 4.500 uIU/mL  Specimen status report     Status: None   Collection Time: 02/07/18 10:23 AM  Result Value Ref Range   specimen status report Comment     Comment: Gloris [place] CMP14 Default Ambig Abbrev CMP14 Default A hand-written panel/profile was received from your office. In accordance with the [facility] Ambiguous Test Code Policy dated July 2003, we have completed your order by using the closest currently or formerly recognized AMA panel.  We have assigned Comprehensive Metabolic Panel (14), Test Code #322000 to this request.  If this is not the testing you wished to receive on this specimen, please contact the [facility] Client Inquiry/Technical Services Department to clarify  the test order.  We appreciate your business.    Lipid Panel     Component Value Date/Time   CHOL 176 09/29/2017 0910   TRIG 205 (H) 09/29/2017 0910   HDL 52 09/29/2017 0910   LDLCALC 83 09/29/2017 0910      Assessment & Plan:   1. Uncontrolled type 2 diabetes mellitus with hyperglycemia   - KYZER BLOWE has currently uncontrolled symptomatic type 2 DM since  52 years of age. -He is responding to metformin treatment, improved his A1c to 5.7% from 11.8%. Recent labs reviewed.  -his diabetes is complicated by adrenal insufficiency on hydrocortisone, hypothyroidism, obesity/sedentary life, chronic heavy smoking and TERRIUS GENTILE remains at a high risk for more acute and chronic complications which include CAD, CVA, CKD, retinopathy, and neuropathy. These are all discussed in detail with the patient.  - I have counseled him on diet management and weight loss, by adopting a carbohydrate restricted/protein rich diet.  -  Suggestion is made for him to avoid simple carbohydrates  from his diet including Cakes, Sweet Desserts / Pastries, Ice Cream, Soda (diet and regular), Sweet Tea, Candies, Chips, Cookies, Store Bought Juices, Alcohol in Excess of  1-2 drinks a day, Artificial Sweeteners, and "Sugar-free" Products. This will help patient to have stable blood glucose profile and potentially avoid unintended weight gain.  - I encouraged him to switch to  unprocessed or minimally processed complex starch and increased protein intake (animal or plant source), fruits, and vegetables.  - he is advised to stick to a routine mealtimes to eat 3 meals  a day and avoid unnecessary snacks ( to snack only to correct hypoglycemia).    - I have approached him with the following individualized plan to manage diabetes and patient agrees:   -Based on his response to metformin treatment, he will not require insulin initiation at this time.    -He is advised to continue metformin 500 mg p.o. twice daily-after breakfast and after supper.  - he will be considered for incretin therapy as appropriate next visit. - Patient specific target  A1c;  LDL, HDL, Triglycerides, and  Waist Circumference were discussed in  detail.  2) BP/HTN: His blood pressure is not controlled to target today.  Unfortunately, he continues to smoke heavily.  He is advised to consider smoking cessation.  I discussed and initiated hydrochlorthiazide 25 mg p.o. daily for him.    3) Lipids/HPL: His recent labs show improved LDL at 83, improving from 161.  He is advised to continue simvastatin 20 mg p.o. nightly.   Side effects are discussed with him.  4)  Weight/Diet: CDE Consult has been  initiated , exercise, and detailed carbohydrates information provided.  5. Adrenal insufficiency (HCC) - The circumstances of his diagnosis 25 years ago are not available available to review. He reports Addison's Disease. He will be considered for work up with anti 21 hydroxylase antibody . -I advised him to lower hydrocortisone to 25 mg total daily dose (15 mg every morning and 10 mg every noon).  I advised him to continue fludrocortisone 0.1mg  po qday.   6. Hypothyroidism, unspecified type -His previsit thyroid function tests are consistent with over replacement.   -I discussed and lowered his levothyroxine dose to 200 mcg p.o. every morning from 225 mcg p.o.  Every morning.    - We discussed about correct intake of levothyroxine, at fasting, with water, separated by at least 30 minutes from breakfast, and separated by more than 4 hours  from calcium, iron, multivitamins, acid reflux medications (PPIs). -Patient is made aware of the fact that thyroid hormone replacement is needed for life, dose to be adjusted by periodic monitoring of thyroid function tests.  7) Chronic Care/Health Maintenance:  -he  is not on  ACEI/ARB, is on  Statin medications and  is encouraged to continue to follow up with Ophthalmology, Dentist,  Podiatrist at least yearly or according to recommendations, and advised to  Quit smoking. I have recommended yearly flu vaccine and pneumonia vaccination at least every 5 years; moderate intensity exercise for up to 150 minutes  weekly; and  sleep for at least 7 hours a day.  - I advised patient to maintain close follow up with Assunta Found, MD for primary care needs.  - Time spent with the patient: 25 min, of which >50% was spent in reviewing his  current and  previous labs, previous treatments, and medications doses and developing a plan for long-term care.  [name] participated in the discussions, expressed understanding, and voiced agreement with the above plans.  All questions were answered to his satisfaction. he is encouraged to contact clinic should he have any questions or concerns prior to his return visit.    Follow up plan: - Return in about 6 months (around 08/17/2018) for Follow up with Pre-visit Labs.  Marquis Lunch, MD [facility] [address] [place], [place] [address] Phone: [phone]  Fax: [phone]    02/15/2018, 5:12 PM  This note was partially dictated with voice recognition software. Similar sounding words can be transcribed inadequately or may not  be corrected upon review.

## 2018-02-15 NOTE — Patient Instructions (Signed)

## 2018-04-06 ENCOUNTER — Other Ambulatory Visit: Payer: Self-pay | Admitting: "Endocrinology

## 2018-04-09 ENCOUNTER — Other Ambulatory Visit: Payer: Self-pay | Admitting: "Endocrinology

## 2018-06-05 ENCOUNTER — Other Ambulatory Visit: Payer: Self-pay | Admitting: "Endocrinology

## 2018-07-14 ENCOUNTER — Other Ambulatory Visit: Payer: Self-pay | Admitting: "Endocrinology

## 2018-07-26 DIAGNOSIS — E271 Primary adrenocortical insufficiency: Secondary | ICD-10-CM | POA: Diagnosis not present

## 2018-08-20 ENCOUNTER — Ambulatory Visit: Payer: BLUE CROSS/BLUE SHIELD | Admitting: "Endocrinology

## 2018-08-31 ENCOUNTER — Other Ambulatory Visit: Payer: Self-pay | Admitting: "Endocrinology

## 2018-09-26 ENCOUNTER — Other Ambulatory Visit: Payer: Self-pay | Admitting: "Endocrinology

## 2018-10-09 ENCOUNTER — Telehealth: Payer: Self-pay | Admitting: "Endocrinology

## 2018-10-09 NOTE — Telephone Encounter (Signed)
Patient needs refill on  fludrocortisone (FLORINEF) 0.1 MG tablet  

## 2018-10-10 ENCOUNTER — Telehealth: Payer: Self-pay

## 2018-10-10 DIAGNOSIS — E782 Mixed hyperlipidemia: Secondary | ICD-10-CM

## 2018-10-10 MED ORDER — FLUDROCORTISONE ACETATE 0.1 MG PO TABS: 100.0000 ug | ORAL_TABLET | Freq: Every day | ORAL | 1 refills | Status: DC

## 2018-10-10 NOTE — Telephone Encounter (Signed)
LeighAnn Aleeha Boline, CMA  

## 2018-10-10 NOTE — Telephone Encounter (Signed)
Done

## 2018-10-11 ENCOUNTER — Other Ambulatory Visit: Payer: Self-pay | Admitting: "Endocrinology

## 2018-10-11 DIAGNOSIS — E782 Mixed hyperlipidemia: Secondary | ICD-10-CM

## 2018-10-12 ENCOUNTER — Other Ambulatory Visit: Payer: Self-pay | Admitting: "Endocrinology

## 2018-10-12 DIAGNOSIS — E1165 Type 2 diabetes mellitus with hyperglycemia: Secondary | ICD-10-CM | POA: Diagnosis not present

## 2018-10-12 DIAGNOSIS — E039 Hypothyroidism, unspecified: Secondary | ICD-10-CM | POA: Diagnosis not present

## 2018-10-13 LAB — COMPREHENSIVE METABOLIC PANEL
ALT: 31 IU/L (ref 0–44)
AST: 22 IU/L (ref 0–40)
Albumin/Globulin Ratio: 2.4 — ABNORMAL HIGH (ref 1.2–2.2)
Albumin: 4.7 g/dL (ref 3.8–4.9)
Alkaline Phosphatase: 70 IU/L (ref 39–117)
BUN/Creatinine Ratio: 14 (ref 9–20)
BUN: 12 mg/dL (ref 6–24)
Bilirubin Total: 0.4 mg/dL (ref 0.0–1.2)
CO2: 23 mmol/L (ref 20–29)
Calcium: 9.4 mg/dL (ref 8.7–10.2)
Chloride: 102 mmol/L (ref 96–106)
Creatinine, Ser: 0.85 mg/dL (ref 0.76–1.27)
GFR calc Af Amer: 115 mL/min/{1.73_m2} (ref >59)
GFR calc non Af Amer: 99 mL/min/{1.73_m2} (ref >59)
Globulin, Total: 2 g/dL (ref 1.5–4.5)
Glucose: 139 mg/dL — ABNORMAL HIGH (ref 65–99)
Potassium: 4.4 mmol/L (ref 3.5–5.2)
Sodium: 141 mmol/L (ref 134–144)
Total Protein: 6.7 g/dL (ref 6.0–8.5)

## 2018-10-13 LAB — T4, FREE: Free T4: 1.13 ng/dL (ref 0.82–1.77)

## 2018-10-13 LAB — TSH: TSH: 3.98 u[IU]/mL (ref 0.450–4.500)

## 2018-10-13 LAB — HGB A1C W/O EAG: Hgb A1c MFr Bld: 6.8 % — ABNORMAL HIGH (ref 4.8–5.6)

## 2018-10-16 ENCOUNTER — Other Ambulatory Visit: Payer: Self-pay | Admitting: "Endocrinology

## 2018-10-23 ENCOUNTER — Encounter: Payer: Self-pay | Admitting: "Endocrinology

## 2018-10-23 ENCOUNTER — Other Ambulatory Visit: Payer: Self-pay

## 2018-10-23 ENCOUNTER — Ambulatory Visit (INDEPENDENT_AMBULATORY_CARE_PROVIDER_SITE_OTHER): Payer: BC Managed Care – PPO | Admitting: "Endocrinology

## 2018-10-23 DIAGNOSIS — I10 Essential (primary) hypertension: Secondary | ICD-10-CM

## 2018-10-23 DIAGNOSIS — E782 Mixed hyperlipidemia: Secondary | ICD-10-CM | POA: Diagnosis not present

## 2018-10-23 DIAGNOSIS — E039 Hypothyroidism, unspecified: Secondary | ICD-10-CM

## 2018-10-23 DIAGNOSIS — E1165 Type 2 diabetes mellitus with hyperglycemia: Secondary | ICD-10-CM | POA: Diagnosis not present

## 2018-10-23 DIAGNOSIS — E2740 Unspecified adrenocortical insufficiency: Secondary | ICD-10-CM

## 2018-10-23 DIAGNOSIS — R7309 Other abnormal glucose: Secondary | ICD-10-CM | POA: Diagnosis not present

## 2018-10-23 MED ORDER — LEVOTHYROXINE SODIUM 200 MCG PO TABS: ORAL_TABLET | ORAL | 1 refills | Status: DC

## 2018-10-23 MED ORDER — HYDROCORTISONE 10 MG PO TABS: ORAL_TABLET | ORAL | 2 refills | Status: DC

## 2018-10-23 MED ORDER — METFORMIN HCL 500 MG PO TABS: 500.0000 mg | ORAL_TABLET | Freq: Two times a day (BID) | ORAL | 1 refills | Status: DC

## 2018-10-23 NOTE — Progress Notes (Signed)
10/23/2018, 4:13 PM                                                    Endocrinology Telehealth Visit Follow up Note -During COVID -19 Pandemic  This visit type was conducted due to national recommendations for restrictions regarding the COVID-19 Pandemic  in an effort to limit this patient's exposure and mitigate transmission of the corona virus.  Due to his co-morbid illnesses, Jeffery Daniels is at  moderate to high risk for complications without adequate follow up.  This format is felt to be most appropriate for him at this time.  I connected with this patient on 10/23/2018   by telephone and verified that I am speaking with the correct person using two identifiers. Jeffery Daniels, 01/05/1966. he has verbally consented to this visit. All issues noted in this document were discussed and addressed. The format was not optimal for physical exam.    Subjective:    Patient ID: Jeffery Daniels, male    DOB: 09/19/1965.  Jeffery Daniels is being engaged in telehealth via telephone in follow-up for management of currently uncontrolled symptomatic type 2 diabetes, adrenal insufficiency, hypothyroidism, hypertension, hyperlipidemia. PMD:   Assunta FoundGolding, John, MD.   Past Medical History:  Diagnosis Date  . Addison disease (HCC)   . Hyperlipidemia    Past Surgical History:  Procedure Laterality Date  . HERNIA REPAIR     Social History   Socioeconomic History  . Marital status: Single    Spouse name: Not on file  . Number of children: Not on file  . Years of education: Not on file  . Highest education level: Not on file  Occupational History  . Not on file  Social Needs  . Financial resource strain: Not on file  . Food insecurity    Worry: Not on file    Inability: Not on file  . Transportation needs    Medical: Not on file    Non-medical: Not on file  Tobacco Use  . Smoking status: Current Every Day Smoker    Packs/day: 1.50    Years: 25.00    Pack years: 37.50     Types: Cigarettes  . Smokeless tobacco: Never Used  Substance and Sexual Activity  . Alcohol use: Yes    Comment: Occasional  . Drug use: Never  . Sexual activity: Never  Lifestyle  . Physical activity    Days per week: Not on file    Minutes per session: Not on file  . Stress: Not on file  Relationships  . Social Musicianconnections    Talks on phone: Not on file    Gets together: Not on file    Attends religious service: Not on file    Active member of club or organization: Not on file    Attends meetings of clubs or organizations: Not on file    Relationship status: Not on file  Other Topics Concern  . Not on file  Social History Narrative  . Not on file   Outpatient Encounter Medications as of 10/23/2018  Medication Sig  . fludrocortisone (FLORINEF) 0.1 MG tablet TAKE 1 TABLET BY MOUTH EVERY DAY  . glucose blood (ONETOUCH VERIO) test strip Use as instructed  . hydrochlorothiazide (HYDRODIURIL) 25 MG tablet TAKE ONE (1) TABLET  BY MOUTH EVERY DAY  . hydrocortisone (CORTEF) 10 MG tablet TAKE ONE AND ONE-HALF TABLET (15MGZISrxiQLUhs$  TOTAL) EVERY MORNING AND ONE TABLET (10MG  TOTAL) EVERYDAY AT NOON  . LANCETS ULTRA FINE MISC 1 each by Does not apply route 4 (four) times daily.  Marland Kitchen. levothyroxine (SYNTHROID) 200 MCG tablet TAKE ONE TABLET (200MCG TOTAL) BY MOUTH DAILY BEFORE BREAKFAST  . metFORMIN (GLUCOPHAGE) 500 MG tablet Take 1 tablet (500 mg total) by mouth 2 (two) times daily with a meal. TAKE TWO TABLETS BY MOUTH TWICE A DAY WITH A MEAL.  . simvastatin (ZOCOR) 20 MG tablet Take 20 mg by mouth daily.  . [DISCONTINUED] hydrocortisone (CORTEF) 10 MG tablet TAKE ONE AND ONE-HALF TABLET (15MG  TOTAL) EVERY MORNING AND ONE TABLET (10MG  TOTAL) EVERYDAY AT NOON  . [DISCONTINUED] levothyroxine (SYNTHROID, LEVOTHROID) 200 MCG tablet TAKE ONE TABLET (200MCG TOTAL) BY MOUTH DAILY BEFORE BREAKFAST  . [DISCONTINUED] metFORMIN (GLUCOPHAGE) 500 MG tablet TAKE TWO TABLETS BY MOUTH TWICE A DAY WITH A MEAL.    No facility-administered encounter medications on file as of 10/23/2018.     ALLERGIES: No Known Allergies  VACCINATION STATUS:  There is no immunization history on file for this patient.  Diabetes He presents for his follow-up diabetic visit. He has type 2 diabetes mellitus. Onset time: He diagnosed at age of 53 years. His disease course has been stable. There are no hypoglycemic associated symptoms. Pertinent negatives for hypoglycemia include no headaches, seizures or tremors. Pertinent negatives for diabetes include no blurred vision, no chest pain, no foot ulcerations, no polydipsia, no polyuria and no weight loss. There are no hypoglycemic complications. Symptoms are stable. There are no diabetic complications. Risk factors for coronary artery disease include diabetes mellitus, dyslipidemia, male sex, obesity, hypertension, sedentary lifestyle, tobacco exposure and family history. When asked about current treatments, none were reported. He is following a generally unhealthy diet. When asked about meal planning, he reported none. He has not had a previous visit with a dietitian. He rarely participates in exercise. (His previsit labs show A1c of 6.8%, stable after he is improving from 11.8%.    ) An ACE inhibitor/angiotensin II receptor blocker is not being taken. He does not see a podiatrist.Eye exam is not current.  Hyperlipidemia This is a chronic problem. The current episode started more than 1 year ago. The problem is uncontrolled. Exacerbating diseases include diabetes, hypothyroidism and obesity. Factors aggravating his hyperlipidemia include smoking. Pertinent negatives include no chest pain, myalgias or shortness of breath. Risk factors for coronary artery disease include dyslipidemia, diabetes mellitus, family history, obesity, male sex, hypertension and a sedentary lifestyle.  Hypertension This is a chronic problem. The current episode started more than 1 year ago. The problem is  unchanged. Pertinent negatives include no blurred vision, chest pain, headaches, palpitations or shortness of breath. Risk factors for coronary artery disease include diabetes mellitus, dyslipidemia, family history, obesity, male gender, sedentary lifestyle and smoking/tobacco exposure. Past treatments include nothing.    Review of Systems  Constitutional: Negative for chills, fever and weight loss.  Eyes: Negative for blurred vision.  Respiratory: Negative for cough and shortness of breath.   Cardiovascular: Negative for chest pain and palpitations.       No Shortness of breath  Gastrointestinal: Negative for abdominal pain, diarrhea, nausea and vomiting.  Endocrine: Negative for polydipsia and polyuria.  Genitourinary: Negative for frequency, hematuria and urgency.  Musculoskeletal: Negative for myalgias.  Skin: Negative for rash.  Neurological: Negative for tremors, seizures and headaches.  Hematological:  Does not bruise/bleed easily.  Psychiatric/Behavioral: Negative for hallucinations and suicidal ideas.    Objective:    There were no vitals taken for this visit.  Wt Readings from Last 3 Encounters:  02/15/18 268 lb (121.6 kg)  10/02/17 266 lb (120.7 kg)  10/02/17 266 lb 12.8 oz (121 kg)      Recent Results (from the past 2160 hour(s))  Comprehensive metabolic panel     Status: Abnormal   Collection Time: 10/12/18  8:30 AM  Result Value Ref Range   Glucose 139 (H) 65 - 99 mg/dL   BUN 12 6 - 24 mg/dL   Creatinine, Ser 1.610.85 0.76 - 1.27 mg/dL   GFR calc non Af Amer 99 >59 mL/min/1.73   GFR calc Af Amer 115 >59 mL/min/1.73   BUN/Creatinine Ratio 14 9 - 20   Sodium 141 134 - 144 mmol/L   Potassium 4.4 3.5 - 5.2 mmol/L   Chloride 102 96 - 106 mmol/L   CO2 23 20 - 29 mmol/L   Calcium 9.4 8.7 - 10.2 mg/dL   Total Protein 6.7 6.0 - 8.5 g/dL   Albumin 4.7 3.8 - 4.9 g/dL   Globulin, Total 2.0 1.5 - 4.5 g/dL   Albumin/Globulin Ratio 2.4 (H) 1.2 - 2.2   Bilirubin Total 0.4 0.0  - 1.2 mg/dL   Alkaline Phosphatase 70 39 - 117 IU/L   AST 22 0 - 40 IU/L   ALT 31 0 - 44 IU/L  Hgb A1c w/o eAG     Status: Abnormal   Collection Time: 10/12/18  8:30 AM  Result Value Ref Range   Hgb A1c MFr Bld 6.8 (H) 4.8 - 5.6 %    Comment:          Prediabetes: 5.7 - 6.4          Diabetes: >6.4          Glycemic control for adults with diabetes: <7.0   T4, free     Status: None   Collection Time: 10/12/18  8:30 AM  Result Value Ref Range   Free T4 1.13 0.82 - 1.77 ng/dL  TSH     Status: None   Collection Time: 10/12/18  8:30 AM  Result Value Ref Range   TSH 3.980 0.450 - 4.500 uIU/mL   Lipid Panel     Component Value Date/Time   CHOL 176 09/29/2017 0910   TRIG 205 (H) 09/29/2017 0910   HDL 52 09/29/2017 0910   LDLCALC 83 09/29/2017 0910     Assessment & Plan:   1. Uncontrolled type 2 diabetes mellitus with hyperglycemia   - Jeffery Daniels has currently controlled asymptomatic type 2 DM since  53 years of age. -He is responding to metformin treatment, improved his A1c overall from 11.8% to 6.8%. Recent labs reviewed.  -his diabetes is complicated by adrenal insufficiency on hydrocortisone, hypothyroidism, obesity/sedentary life, chronic heavy smoking and Jeffery Daniels remains at a high risk for more acute and chronic complications which include CAD, CVA, CKD, retinopathy, and neuropathy. These are all discussed in detail with the patient.  - I have counseled him on diet management and weight loss, by adopting a carbohydrate restricted/protein rich diet.  - he  admits there is a room for improvement in his diet and drink choices. -  Suggestion is made for him to avoid simple carbohydrates  from his diet including Cakes, Sweet Desserts / Pastries, Ice Cream, Soda (diet and regular), Sweet Tea, Candies, Chips, Cookies, Sweet Pastries,  Store Bought Juices, Alcohol in Excess of  1-2 drinks a day, Artificial Sweeteners, Coffee Creamer, and "Sugar-free" Products. This will  help patient to have stable blood glucose profile and potentially avoid unintended weight gain.   - I encouraged him to switch to  unprocessed or minimally processed complex starch and increased protein intake (animal or plant source), fruits, and vegetables.  - he is advised to stick to a routine mealtimes to eat 3 meals  a day and avoid unnecessary snacks ( to snack only to correct hypoglycemia).    - I have approached him with the following individualized plan to manage diabetes and patient agrees:   -Based on his response to metformin treatment, he will not require insulin initiation at this time.    -He is advised to continue metformin 500 mg p.o. twice a day after breakfast and supper.   - he will be considered for incretin therapy as appropriate next visit. - Patient specific target  A1c;  LDL, HDL, Triglycerides, and  Waist Circumference were discussed in detail.  2) BP/HTN: he is advised to home monitor blood pressure and report if > 140/90 on 2 separate readings.  He is tolerating hydrochlorothiazide.  He is advised to continue.  He was previously counseled for smoking cessation.    3) Lipids/HPL: His recent labs show improved LDL at 83, improving from 118.  He is advised to continue simvastatin 20 mg p.o. nightly.   Side effects are discussed with him.  4)  Weight/Diet: CDE Consult has been  initiated , exercise, and detailed carbohydrates information provided.  5. Adrenal insufficiency ([place]) - The circumstances of his diagnosis 25 years ago are not available available to review. He reports Addison's Disease. He will be considered for work up with anti 21 hydroxylase antibody . -He is on a stable dose of steroids.  He is advised to continue the cortisone 25 mg total daily dose (15 mg every morning and 10 mg every noon).    -He is advised to continue fludrocortisone 0.1mg  po qday.   6. Hypothyroidism, unspecified type -His previsit thyroid function tests are consistent with  over replacement.   -He is advised to continue levothyroxine 200 mcg p.o. daily before breakfast.  - We discussed about the correct intake of his thyroid hormone, on empty stomach at fasting, with water, separated by at least 30 minutes from breakfast and other medications,  and separated by more than 4 hours from calcium, iron, multivitamins, acid reflux medications (PPIs). -Patient is made aware of the fact that thyroid hormone replacement is needed for life, dose to be adjusted by periodic monitoring of thyroid function tests.   7) Chronic Care/Health Maintenance:  -he  is not on  ACEI/ARB, is on  Statin medications and  is encouraged to continue to follow up with Ophthalmology, Dentist,  Podiatrist at least yearly or according to recommendations, and advised to  Quit smoking. I have recommended yearly flu vaccine and pneumonia vaccination at least every 5 years; moderate intensity exercise for up to 150 minutes weekly; and  sleep for at least 7 hours a day.  - I advised patient to maintain close follow up with Sharilyn Sites, MD for primary care needs.  - Patient Care Time Today:  25 min, of which >50% was spent in reviewing his  current and  previous labs/studies, previous treatments, and medications doses and developing a plan for long-term care based on the latest recommendations for standards of care.  Kathrin Penner participated in  the discussions, expressed understanding, and voiced agreement with the above plans.  All questions were answered to his satisfaction. he is encouraged to contact clinic should he have any questions or concerns prior to his return visit.   Follow up plan: - Return in about 6 months (around 04/24/2019) for Follow up with Pre-visit Labs.  Marquis Lunch, MD [facility] [address] [place], [place] [address] Phone: [phone]  Fax: [phone]    10/23/2018, 4:13 PM  This note was partially dictated  with voice recognition software. Similar sounding words can be transcribed inadequately or may not  be corrected upon review.

## 2018-10-25 ENCOUNTER — Ambulatory Visit: Payer: Self-pay | Admitting: "Endocrinology

## 2018-12-06 ENCOUNTER — Other Ambulatory Visit: Payer: Self-pay | Admitting: "Endocrinology

## 2019-04-15 ENCOUNTER — Other Ambulatory Visit: Payer: Self-pay | Admitting: "Endocrinology

## 2019-04-25 ENCOUNTER — Other Ambulatory Visit: Payer: Self-pay | Admitting: "Endocrinology

## 2019-04-25 ENCOUNTER — Ambulatory Visit: Payer: BC Managed Care – PPO | Admitting: "Endocrinology

## 2019-04-25 DIAGNOSIS — E2740 Unspecified adrenocortical insufficiency: Secondary | ICD-10-CM | POA: Diagnosis not present

## 2019-04-25 DIAGNOSIS — I10 Essential (primary) hypertension: Secondary | ICD-10-CM | POA: Diagnosis not present

## 2019-04-25 DIAGNOSIS — E559 Vitamin D deficiency, unspecified: Secondary | ICD-10-CM | POA: Diagnosis not present

## 2019-04-25 DIAGNOSIS — R7309 Other abnormal glucose: Secondary | ICD-10-CM | POA: Diagnosis not present

## 2019-04-26 LAB — TSH: TSH: 5.07 u[IU]/mL — ABNORMAL HIGH (ref 0.450–4.500)

## 2019-04-26 LAB — COMPREHENSIVE METABOLIC PANEL
ALT: 26 IU/L (ref 0–44)
AST: 18 IU/L (ref 0–40)
Albumin/Globulin Ratio: 1.9 (ref 1.2–2.2)
Albumin: 4.3 g/dL (ref 3.8–4.9)
Alkaline Phosphatase: 84 IU/L (ref 39–117)
BUN/Creatinine Ratio: 10 (ref 9–20)
BUN: 10 mg/dL (ref 6–24)
Bilirubin Total: 0.4 mg/dL (ref 0.0–1.2)
CO2: 24 mmol/L (ref 20–29)
Calcium: 9.6 mg/dL (ref 8.7–10.2)
Chloride: 102 mmol/L (ref 96–106)
Creatinine, Ser: 1.05 mg/dL (ref 0.76–1.27)
GFR calc Af Amer: 93 mL/min/{1.73_m2} (ref >59)
GFR calc non Af Amer: 81 mL/min/{1.73_m2} (ref >59)
Globulin, Total: 2.3 g/dL (ref 1.5–4.5)
Glucose: 138 mg/dL — ABNORMAL HIGH (ref 65–99)
Potassium: 4.1 mmol/L (ref 3.5–5.2)
Sodium: 142 mmol/L (ref 134–144)
Total Protein: 6.6 g/dL (ref 6.0–8.5)

## 2019-04-26 LAB — T4, FREE: Free T4: 1.2 ng/dL (ref 0.82–1.77)

## 2019-04-26 LAB — VITAMIN D 25 HYDROXY (VIT D DEFICIENCY, FRACTURES): Vit D, 25-Hydroxy: 22.7 ng/mL — ABNORMAL LOW (ref 30.0–100.0)

## 2019-04-26 LAB — HGB A1C W/O EAG: Hgb A1c MFr Bld: 7.9 % — ABNORMAL HIGH (ref 4.8–5.6)

## 2019-04-30 ENCOUNTER — Encounter: Payer: Self-pay | Admitting: "Endocrinology

## 2019-04-30 ENCOUNTER — Telehealth: Payer: Self-pay

## 2019-04-30 ENCOUNTER — Ambulatory Visit (INDEPENDENT_AMBULATORY_CARE_PROVIDER_SITE_OTHER): Payer: BC Managed Care – PPO | Admitting: "Endocrinology

## 2019-04-30 DIAGNOSIS — E2740 Unspecified adrenocortical insufficiency: Secondary | ICD-10-CM

## 2019-04-30 DIAGNOSIS — E782 Mixed hyperlipidemia: Secondary | ICD-10-CM | POA: Diagnosis not present

## 2019-04-30 DIAGNOSIS — E1165 Type 2 diabetes mellitus with hyperglycemia: Secondary | ICD-10-CM

## 2019-04-30 DIAGNOSIS — I10 Essential (primary) hypertension: Secondary | ICD-10-CM

## 2019-04-30 DIAGNOSIS — E559 Vitamin D deficiency, unspecified: Secondary | ICD-10-CM

## 2019-04-30 DIAGNOSIS — E039 Hypothyroidism, unspecified: Secondary | ICD-10-CM

## 2019-04-30 MED ORDER — METFORMIN HCL 1000 MG PO TABS: 1000.0000 mg | ORAL_TABLET | Freq: Two times a day (BID) | ORAL | 1 refills | Status: DC

## 2019-04-30 MED ORDER — VITAMIN D3 125 MCG (5000 UT) PO CAPS: 5000.0000 [IU] | ORAL_CAPSULE | Freq: Every day | ORAL | 0 refills | Status: DC

## 2019-04-30 MED ORDER — LEVOTHYROXINE SODIUM 25 MCG PO TABS: 25.0000 ug | ORAL_TABLET | Freq: Every day | ORAL | 1 refills | Status: DC

## 2019-04-30 MED ORDER — METFORMIN HCL 1000 MG PO TABS: 500.0000 mg | ORAL_TABLET | Freq: Two times a day (BID) | ORAL | 1 refills | Status: DC

## 2019-04-30 NOTE — Progress Notes (Signed)
04/30/2019, 8:58 PM                                                    Endocrinology Telehealth Visit Follow up Note -During COVID -19 Pandemic  This visit type was conducted due to national recommendations for restrictions regarding the COVID-19 Pandemic  in an effort to limit this patient's exposure and mitigate transmission of the corona virus.  Due to his co-morbid illnesses, Jeffery Daniels is at  moderate to high risk for complications without adequate follow up.  This format is felt to be most appropriate for him at this time.  I connected with this patient on 04/30/2019   by telephone and verified that I am speaking with the correct person using two identifiers. Jeffery Daniels, [birth year]. he has verbally consented to this visit. All issues noted in this document were discussed and addressed. The format was not optimal for physical exam.    Subjective:    Patient ID: Jeffery Daniels, male    DOB: 1965/04/26.  Jeffery Daniels is being engaged in telehealth via telephone in follow-up for management of currently uncontrolled symptomatic type 2 diabetes, adrenal insufficiency, hypothyroidism, hypertension, hyperlipidemia. PMD:   Assunta Found, MD.   Past Medical History:  Diagnosis Date  . Addison disease (HCC)   . Hyperlipidemia    Past Surgical History:  Procedure Laterality Date  . HERNIA REPAIR     Social History   Socioeconomic History  . Marital status: Single    Spouse name: Not on file  . Number of children: Not on file  . Years of education: Not on file  . Highest education level: Not on file  Occupational History  . Not on file  Tobacco Use  . Smoking status: Current Every Day Smoker    Packs/day: 1.50    Years: 25.00    Pack years: 37.50    Types: Cigarettes  . Smokeless tobacco: Never Used  Substance and Sexual Activity  . Alcohol use: Yes    Comment: Occasional  . Drug use: Never  . Sexual activity: Never  Other Topics Concern  .  Not on file  Social History Narrative  . Not on file   Social Determinants of Health   Financial Resource Strain:   . Difficulty of Paying Living Expenses: Not on file  Food Insecurity:   . Worried About Programme researcher, broadcasting/film/video in the Last Year: Not on file  . Ran Out of Food in the Last Year: Not on file  Transportation Needs:   . Lack of Transportation (Medical): Not on file  . Lack of Transportation (Non-Medical): Not on file  Physical Activity:   . Days of Exercise per Week: Not on file  . Minutes of Exercise per Session: Not on file  Stress:   . Feeling of Stress : Not on file  Social Connections:   . Frequency of Communication with Friends and Family: Not on file  . Frequency of Social Gatherings with Friends and Family: Not on file  . Attends Religious Services: Not on file  . Active Member of Clubs or Organizations: Not on file  . Attends Banker Meetings: Not on file  . Marital Status: Not on file   Outpatient Encounter Medications as of 04/30/2019  Medication Sig  .  Cholecalciferol (VITAMIN D3) 125 MCG (5000 UT) CAPS Take 1 capsule (5,000 Units total) by mouth daily.  . fludrocortisone (FLORINEF) 0.1 MG tablet TAKE 1 TABLET BY MOUTH EVERY DAY  . glucose blood (ONETOUCH VERIO) test strip Use as instructed  . hydrochlorothiazide (HYDRODIURIL) 25 MG tablet TAKE ONE (1) TABLET BY MOUTH EVERY DAY  . hydrocortisone (CORTEF) 10 MG tablet TAKE ONE AND ONE-HALF TABLET (15MG  TOTAL) EVERY MORNING AND ONE TABLET (10MG  TOTAL) EVERYDAY AT NOON  . LANCETS ULTRA FINE MISC 1 each by Does not apply route 4 (four) times daily.  Marland Kitchen. levothyroxine (SYNTHROID) 200 MCG tablet TAKE ONE TABLET (200MCG TOTAL) BY MOUTH DAILY BEFORE BREAKFAST  . levothyroxine (SYNTHROID) 25 MCG tablet Take 1 tablet (25 mcg total) by mouth daily before breakfast.  . metFORMIN (GLUCOPHAGE) 1000 MG tablet Take 0.5 tablets (500 mg total) by mouth 2 (two) times daily with a meal. TAKE TWO TABLETS BY MOUTH TWICE A  DAY WITH A MEAL.  . simvastatin (ZOCOR) 20 MG tablet TAKE ONE (1) TABLET BY MOUTH EVERY DAY  . [DISCONTINUED] metFORMIN (GLUCOPHAGE) 500 MG tablet Take 1 tablet (500 mg total) by mouth 2 (two) times daily with a meal. TAKE TWO TABLETS BY MOUTH TWICE A DAY WITH A MEAL.   No facility-administered encounter medications on file as of 04/30/2019.    ALLERGIES: No Known Allergies  VACCINATION STATUS:  There is no immunization history on file for this patient.  Diabetes He presents for his follow-up diabetic visit. He has type 2 diabetes mellitus. Onset time: He diagnosed at age of 54 years. His disease course has been worsening. There are no hypoglycemic associated symptoms. Pertinent negatives for hypoglycemia include no headaches, seizures or tremors. Pertinent negatives for diabetes include no blurred vision, no chest pain, no foot ulcerations, no polydipsia, no polyuria and no weight loss. There are no hypoglycemic complications. Symptoms are worsening. There are no diabetic complications. Risk factors for coronary artery disease include diabetes mellitus, dyslipidemia, male sex, obesity, hypertension, sedentary lifestyle, tobacco exposure and family history. When asked about current treatments, none were reported. His weight is fluctuating minimally. He is following a generally unhealthy diet. When asked about meal planning, he reported none. He has not had a previous visit with a dietitian. He rarely participates in exercise. (   ) An ACE inhibitor/angiotensin II receptor blocker is not being taken. He does not see a podiatrist.Eye exam is not current.  Hyperlipidemia This is a chronic problem. The current episode started more than 1 year ago. The problem is uncontrolled. Exacerbating diseases include diabetes, hypothyroidism and obesity. Factors aggravating his hyperlipidemia include smoking. Pertinent negatives include no chest pain, myalgias or shortness of breath. Risk factors for coronary  artery disease include dyslipidemia, diabetes mellitus, family history, obesity, male sex, hypertension and a sedentary lifestyle.  Hypertension This is a chronic problem. The current episode started more than 1 year ago. The problem is unchanged. Pertinent negatives include no blurred vision, chest pain, headaches, palpitations or shortness of breath. Risk factors for coronary artery disease include diabetes mellitus, dyslipidemia, family history, obesity, male gender, sedentary lifestyle and smoking/tobacco exposure. Past treatments include nothing.     Objective:    There were no vitals taken for this visit.  Wt Readings from Last 3 Encounters:  02/15/18 268 lb (121.6 kg)  10/02/17 266 lb (120.7 kg)  10/02/17 266 lb 12.8 oz (121 kg)     Recent Results (from the past 2160 hour(s))  Comprehensive metabolic panel  Status: Abnormal   Collection Time: 04/25/19  8:58 AM  Result Value Ref Range   Glucose 138 (H) 65 - 99 mg/dL   BUN 10 6 - 24 mg/dL   Creatinine, Ser 7.20 0.76 - 1.27 mg/dL   GFR calc non Af Amer 81 >59 mL/min/1.73   GFR calc Af Amer 93 >59 mL/min/1.73   BUN/Creatinine Ratio 10 9 - 20   Sodium 142 134 - 144 mmol/L   Potassium 4.1 3.5 - 5.2 mmol/L   Chloride 102 96 - 106 mmol/L   CO2 24 20 - 29 mmol/L   Calcium 9.6 8.7 - 10.2 mg/dL   Total Protein 6.6 6.0 - 8.5 g/dL   Albumin 4.3 3.8 - 4.9 g/dL   Globulin, Total 2.3 1.5 - 4.5 g/dL   Albumin/Globulin Ratio 1.9 1.2 - 2.2   Bilirubin Total 0.4 0.0 - 1.2 mg/dL   Alkaline Phosphatase 84 39 - 117 IU/L   AST 18 0 - 40 IU/L   ALT 26 0 - 44 IU/L  Hgb A1c w/o eAG     Status: Abnormal   Collection Time: 04/25/19  8:58 AM  Result Value Ref Range   Hgb A1c MFr Bld 7.9 (H) 4.8 - 5.6 %    Comment:          Prediabetes: 5.7 - 6.4          Diabetes: >6.4          Glycemic control for adults with diabetes: <7.0   T4, free     Status: None   Collection Time: 04/25/19  8:58 AM  Result Value Ref Range   Free T4 1.20 0.82 -  1.77 ng/dL  TSH     Status: Abnormal   Collection Time: 04/25/19  8:58 AM  Result Value Ref Range   TSH 5.070 (H) 0.450 - 4.500 uIU/mL  VITAMIN D 25 Hydroxy (Vit-D Deficiency, Fractures)     Status: Abnormal   Collection Time: 04/25/19  8:58 AM  Result Value Ref Range   Vit D, 25-Hydroxy 22.7 (L) 30.0 - 100.0 ng/mL    Comment: Vitamin D deficiency has been defined by the Institute of Medicine and an Endocrine Society practice guideline as a level of serum 25-OH vitamin D less than 20 ng/mL (1,2). The Endocrine Society went on to further define vitamin D insufficiency as a level between 21 and 29 ng/mL (2). 1. IOM (Institute of Medicine). 2010. Dietary reference    intakes for calcium and D. [place]: The    [employer]. 2. Holick MF, Binkley [place], Bischoff-Ferrari HA, et al.    Evaluation, treatment, and prevention of vitamin D    deficiency: an Endocrine Society clinical practice    guideline. JCEM. 2011 Jul; 96(7):1911-30.    Lipid Panel     Component Value Date/Time   CHOL 176 09/29/2017 0910   TRIG 205 (H) 09/29/2017 0910   HDL 52 09/29/2017 0910   LDLCALC 83 09/29/2017 0910     Assessment & Plan:   1. Uncontrolled type 2 diabetes mellitus with hyperglycemia   - SHANNON KIRKENDALL has currently controlled asymptomatic type 2 DM since  55 years of age. -His previsit labs show A1c of 7.9% increasing from 6.8%.  - Recent labs reviewed.  -his diabetes is complicated by adrenal insufficiency on hydrocortisone, hypothyroidism, obesity/sedentary life, chronic heavy smoking and ULIS KAPS remains at a high risk for more acute and chronic complications which include CAD, CVA, CKD, retinopathy, and neuropathy. These  are all discussed in detail with the patient.  - I have counseled him on diet management and weight loss, by adopting a carbohydrate restricted/protein rich diet.  - he  admits there is a room for improvement in his diet and drink choices. -   Suggestion is made for him to avoid simple carbohydrates  from his diet including Cakes, Sweet Desserts / Pastries, Ice Cream, Soda (diet and regular), Sweet Tea, Candies, Chips, Cookies, Sweet Pastries,  Store Bought Juices, Alcohol in Excess of  1-2 drinks a day, Artificial Sweeteners, Coffee Creamer, and "Sugar-free" Products. This will help patient to have stable blood glucose profile and potentially avoid unintended weight gain.  - I encouraged him to switch to  unprocessed or minimally processed complex starch and increased protein intake (animal or plant source), fruits, and vegetables.  - he is advised to stick to a routine mealtimes to eat 3 meals  a day and avoid unnecessary snacks ( to snack only to correct hypoglycemia).    - I have approached him with the following individualized plan to manage diabetes and patient agrees:   -Based on his response to metformin treatment, he will not require insulin initiation at this time.  He continues to have normal renal function.  He is advised to increase his Metformin to 1000 mg p.o. twice daily-after breakfast and after supper. - he will be considered for incretin therapy as appropriate next visit. - Patient specific target  A1c;  LDL, HDL, Triglycerides, and  Waist Circumference were discussed in detail.  2) BP/HTN:   he is advised to home monitor blood pressure and report if > 140/90 on 2 separate readings.  He is tolerating hydrochlorothiazide.  He is advised to continue.  He was previously counseled for smoking cessation.    3) Lipids/HPL: His recent labs show improved LDL at 83, improving from 161.  He is advised to continue simvastatin 20 mg p.o. nightly.     Side effects are discussed with him.  4)  Weight/Diet: CDE Consult has been  initiated , exercise, and detailed carbohydrates information provided.  5. Adrenal insufficiency (HCC) - The circumstances of his diagnosis 25 years ago are not available available to review. He reports  Addison's Disease. He will be considered for work up with anti 21 hydroxylase antibody . -He is on a stable dose of steroids.  He is advised to continue his current steroid replacement therapy including hydrocortisone 15 mg p.o. daily in the morning, and 10 mg daily at noon.   -He is advised to continue fludrocortisone 0.1mg  po qday.   6. Hypothyroidism -His previsit thyroid function tests are consistent with under replacement.     -I discussed and added extra 25 mcg of levothyroxine to his current dose of levothyroxine 200 mcg to make a total of 225 mcg p.o. daily before  breakfast.     - We discussed about the correct intake of his thyroid hormone, on empty stomach at fasting, with water, separated by at least 30 minutes from breakfast and other medications,  and separated by more than 4 hours from calcium, iron, multivitamins, acid reflux medications (PPIs). -Patient is made aware of the fact that thyroid hormone replacement is needed for life, dose to be adjusted by periodic monitoring of thyroid function tests.  7) Chronic Care/Health Maintenance:  -he  is not on  ACEI/ARB, is on  Statin medications and  is encouraged to continue to follow up with Ophthalmology, Dentist,  Podiatrist at least yearly or according  to recommendations, and advised to  Quit smoking. I have recommended yearly flu vaccine and pneumonia vaccination at least every 5 years; moderate intensity exercise for up to 150 minutes weekly; and  sleep for at least 7 hours a day.  - I advised patient to maintain close follow up with Sharilyn Sites, MD for primary care needs.  - Time spent on this patient care encounter:  35 min, of which >50% was spent in  counseling and the rest reviewing his  current and  previous labs/studies ( including abstraction from other facilities),  previous treatments, his blood glucose readings, and medications' doses and developing a plan for long-term care based on the latest recommendations for  standards of care; and documenting his care.  Kathrin Penner participated in the discussions, expressed understanding, and voiced agreement with the above plans.  All questions were answered to his satisfaction. he is encouraged to contact clinic should he have any questions or concerns prior to his return visit.    Follow up plan: - Return in about 6 months (around 10/28/2019) for Follow up with Pre-visit Labs, Next Visit A1c in Office.  Glade Lloyd, MD [facility] [address] [place], [place] [address] Phone: [phone]  Fax: [phone]    04/30/2019, 8:58 PM  This note was partially dictated with voice recognition software. Similar sounding words can be transcribed inadequately or may not  be corrected upon review.

## 2019-04-30 NOTE — Telephone Encounter (Signed)
Which dosage of Metformin is pt on and how many tablets.

## 2019-04-30 NOTE — Telephone Encounter (Signed)
Switched to Metformin 1000mg  po BID ( 1 tab 2 x a day).

## 2019-05-01 MED ORDER — METFORMIN HCL 1000 MG PO TABS: 1000.0000 mg | ORAL_TABLET | Freq: Two times a day (BID) | ORAL | 1 refills | Status: DC

## 2019-05-22 ENCOUNTER — Other Ambulatory Visit: Payer: Self-pay | Admitting: "Endocrinology

## 2019-05-23 ENCOUNTER — Other Ambulatory Visit: Payer: Self-pay

## 2019-05-23 MED ORDER — LEVOTHYROXINE SODIUM 200 MCG PO TABS: ORAL_TABLET | ORAL | 1 refills | Status: DC

## 2019-07-23 ENCOUNTER — Other Ambulatory Visit: Payer: Self-pay | Admitting: "Endocrinology

## 2019-07-23 DIAGNOSIS — E782 Mixed hyperlipidemia: Secondary | ICD-10-CM

## 2019-08-07 ENCOUNTER — Other Ambulatory Visit: Payer: Self-pay | Admitting: "Endocrinology

## 2019-10-24 ENCOUNTER — Other Ambulatory Visit: Payer: Self-pay | Admitting: "Endocrinology

## 2019-10-24 DIAGNOSIS — E782 Mixed hyperlipidemia: Secondary | ICD-10-CM

## 2019-10-30 ENCOUNTER — Ambulatory Visit: Payer: BC Managed Care – PPO | Admitting: "Endocrinology

## 2019-11-04 ENCOUNTER — Other Ambulatory Visit: Payer: Self-pay | Admitting: "Endocrinology

## 2019-11-05 ENCOUNTER — Other Ambulatory Visit: Payer: Self-pay | Admitting: "Endocrinology

## 2019-11-06 ENCOUNTER — Other Ambulatory Visit: Payer: Self-pay

## 2019-11-06 DIAGNOSIS — E039 Hypothyroidism, unspecified: Secondary | ICD-10-CM

## 2019-11-06 DIAGNOSIS — E1165 Type 2 diabetes mellitus with hyperglycemia: Secondary | ICD-10-CM

## 2019-11-06 DIAGNOSIS — E559 Vitamin D deficiency, unspecified: Secondary | ICD-10-CM

## 2019-11-06 DIAGNOSIS — E2740 Unspecified adrenocortical insufficiency: Secondary | ICD-10-CM

## 2019-11-06 DIAGNOSIS — E782 Mixed hyperlipidemia: Secondary | ICD-10-CM

## 2019-11-06 DIAGNOSIS — I10 Essential (primary) hypertension: Secondary | ICD-10-CM

## 2019-11-11 DIAGNOSIS — T1512XA Foreign body in conjunctival sac, left eye, initial encounter: Secondary | ICD-10-CM | POA: Diagnosis not present

## 2019-11-11 DIAGNOSIS — H10022 Other mucopurulent conjunctivitis, left eye: Secondary | ICD-10-CM | POA: Diagnosis not present

## 2019-11-11 DIAGNOSIS — H10013 Acute follicular conjunctivitis, bilateral: Secondary | ICD-10-CM | POA: Diagnosis not present

## 2019-11-11 DIAGNOSIS — Z6838 Body mass index (BMI) 38.0-38.9, adult: Secondary | ICD-10-CM | POA: Diagnosis not present

## 2019-11-22 ENCOUNTER — Other Ambulatory Visit: Payer: Self-pay | Admitting: "Endocrinology

## 2019-11-22 DIAGNOSIS — E559 Vitamin D deficiency, unspecified: Secondary | ICD-10-CM | POA: Diagnosis not present

## 2019-11-22 DIAGNOSIS — I10 Essential (primary) hypertension: Secondary | ICD-10-CM | POA: Diagnosis not present

## 2019-11-22 DIAGNOSIS — E1165 Type 2 diabetes mellitus with hyperglycemia: Secondary | ICD-10-CM | POA: Diagnosis not present

## 2019-11-22 DIAGNOSIS — E039 Hypothyroidism, unspecified: Secondary | ICD-10-CM | POA: Diagnosis not present

## 2019-11-22 DIAGNOSIS — E2740 Unspecified adrenocortical insufficiency: Secondary | ICD-10-CM | POA: Diagnosis not present

## 2019-11-23 LAB — COMPREHENSIVE METABOLIC PANEL
ALT: 43 IU/L (ref 0–44)
AST: 34 IU/L (ref 0–40)
Albumin/Globulin Ratio: 2 (ref 1.2–2.2)
Albumin: 4.7 g/dL (ref 3.8–4.9)
Alkaline Phosphatase: 86 IU/L (ref 48–121)
BUN/Creatinine Ratio: 10 (ref 9–20)
BUN: 9 mg/dL (ref 6–24)
Bilirubin Total: 0.6 mg/dL (ref 0.0–1.2)
CO2: 23 mmol/L (ref 20–29)
Calcium: 9.4 mg/dL (ref 8.7–10.2)
Chloride: 100 mmol/L (ref 96–106)
Creatinine, Ser: 0.92 mg/dL (ref 0.76–1.27)
GFR calc Af Amer: 109 mL/min/{1.73_m2} (ref >59)
GFR calc non Af Amer: 94 mL/min/{1.73_m2} (ref >59)
Globulin, Total: 2.4 g/dL (ref 1.5–4.5)
Glucose: 166 mg/dL — ABNORMAL HIGH (ref 65–99)
Potassium: 4.4 mmol/L (ref 3.5–5.2)
Sodium: 139 mmol/L (ref 134–144)
Total Protein: 7.1 g/dL (ref 6.0–8.5)

## 2019-11-23 LAB — TSH: TSH: 12 u[IU]/mL — ABNORMAL HIGH (ref 0.450–4.500)

## 2019-11-23 LAB — MICROALBUMIN, URINE: Microalbumin, Urine: 18.5 ug/mL

## 2019-11-23 LAB — VITAMIN D 25 HYDROXY (VIT D DEFICIENCY, FRACTURES): Vit D, 25-Hydroxy: 20.9 ng/mL — ABNORMAL LOW (ref 30.0–100.0)

## 2019-11-23 LAB — T4, FREE: Free T4: 1.27 ng/dL (ref 0.82–1.77)

## 2019-11-26 ENCOUNTER — Other Ambulatory Visit: Payer: Self-pay | Admitting: "Endocrinology

## 2019-11-28 ENCOUNTER — Ambulatory Visit: Payer: BC Managed Care – PPO | Admitting: "Endocrinology

## 2019-11-28 ENCOUNTER — Other Ambulatory Visit: Payer: Self-pay

## 2019-11-28 ENCOUNTER — Encounter: Payer: Self-pay | Admitting: "Endocrinology

## 2019-11-28 VITALS — BP 128/96 | HR 76 | Ht 71.0 in | Wt 278.4 lb

## 2019-11-28 DIAGNOSIS — E559 Vitamin D deficiency, unspecified: Secondary | ICD-10-CM | POA: Diagnosis not present

## 2019-11-28 DIAGNOSIS — E039 Hypothyroidism, unspecified: Secondary | ICD-10-CM | POA: Diagnosis not present

## 2019-11-28 DIAGNOSIS — I10 Essential (primary) hypertension: Secondary | ICD-10-CM

## 2019-11-28 DIAGNOSIS — E782 Mixed hyperlipidemia: Secondary | ICD-10-CM

## 2019-11-28 DIAGNOSIS — E2740 Unspecified adrenocortical insufficiency: Secondary | ICD-10-CM

## 2019-11-28 DIAGNOSIS — E1165 Type 2 diabetes mellitus with hyperglycemia: Secondary | ICD-10-CM

## 2019-11-28 DIAGNOSIS — R7309 Other abnormal glucose: Secondary | ICD-10-CM | POA: Diagnosis not present

## 2019-11-28 LAB — POCT GLYCOSYLATED HEMOGLOBIN (HGB A1C): Hemoglobin A1C: 8.4 % — AB (ref 4.0–5.6)

## 2019-11-28 MED ORDER — GLIPIZIDE ER 5 MG PO TB24
5.0000 mg | ORAL_TABLET | Freq: Every day | ORAL | 1 refills | Status: DC
Start: 2019-11-28 — End: 2020-03-17

## 2019-11-28 MED ORDER — FLUDROCORTISONE ACETATE 0.1 MG PO TABS: ORAL_TABLET | ORAL | 1 refills | Status: DC

## 2019-11-28 NOTE — Progress Notes (Signed)
11/28/2019, 6:40 PM   Endocrinology follow-up note   Subjective:    Patient ID: Jeffery Daniels, male    DOB: 03/20/1966.  Jeffery Daniels is being seen in follow-up for management of currently uncontrolled type 2 diabetes, adrenal insufficiency, hypothyroidism, hyperlipidemia, hypertension.    PMD:   Jeffery Sites, MD.   Past Medical History:  Diagnosis Date  . Addison disease ([place])   . Hyperlipidemia    Past Surgical History:  Procedure Laterality Date  . HERNIA REPAIR     Social History   Socioeconomic History  . Marital status: Single    Spouse name: Not on file  . Number of children: Not on file  . Years of education: Not on file  . Highest education level: Not on file  Occupational History  . Not on file  Tobacco Use  . Smoking status: Current Every Day Smoker    Packs/day: 1.50    Years: 25.00    Pack years: 37.50    Types: Cigarettes  . Smokeless tobacco: Never Used  Vaping Use  . Vaping Use: Never used  Substance and Sexual Activity  . Alcohol use: Yes    Comment: Occasional  . Drug use: Never  . Sexual activity: Never  Other Topics Concern  . Not on file  Social History Narrative  . Not on file   Social Determinants of Health   Financial Resource Strain:   . Difficulty of Paying Living Expenses:   Food Insecurity:   . Worried About Charity fundraiser in the Last Year:   . Arboriculturist in the Last Year:   Transportation Needs:   . Film/video editor (Medical):   Marland Kitchen Lack of Transportation (Non-Medical):   Physical Activity:   . Days of Exercise per Week:   . Minutes of Exercise per Session:   Stress:   . Feeling of Stress :   Social Connections:   . Frequency of Communication with Friends and Family:   . Frequency of Social Gatherings with Friends and Family:   . Attends Religious Services:   . Active Member of Clubs or Organizations:   . Attends Archivist Meetings:   Marland Kitchen Marital Status:     Outpatient Encounter Medications as of 11/28/2019  Medication Sig  . Cholecalciferol (VITAMIN D3) 125 MCG (5000 UT) CAPS Take 1 capsule (5,000 Units total) by mouth daily.  . fludrocortisone (FLORINEF) 0.1 MG tablet TAKE ONE (1) TABLET BY MOUTH EVERY DAY  . glipiZIDE (GLUCOTROL XL) 5 MG 24 hr tablet Take 1 tablet (5 mg total) by mouth daily with breakfast.  . glucose blood (ONETOUCH VERIO) test strip Use as instructed  . hydrochlorothiazide (HYDRODIURIL) 25 MG tablet TAKE ONE (1) TABLET BY MOUTH EVERY DAY  . hydrocortisone (CORTEF) 10 MG tablet TAKE ONE AND ONE-HALF TABLET (15MG TOTAL) EVERY MORNING AND ONE TABLET (10MG TOTAL) EVERYDAY AT NOON  . LANCETS ULTRA FINE MISC 1 each by Does not apply route 4 (four) times daily.  Marland Kitchen levothyroxine (SYNTHROID) 200 MCG tablet TAKE ONE TABLET (200MG TOTAL) BY MOUTH DAILY BEFORE BREAKFAST TAKE ALONG WITH 25MCG TO GET A TOTAL OF 225MCG DAILY  . levothyroxine (SYNTHROID) 25 MCG tablet TAKE ONE TABLET (25MCG TOTAL) BY MOUTH DAILY BEFORE BREAKFAST  . metFORMIN (GLUCOPHAGE) 1000 MG tablet TAKE ONE TABLET (1000MG) BY MOUTH TWICE DAILY WITH A MEAL  . simvastatin (ZOCOR) 20 MG tablet TAKE ONE (1) TABLET BY MOUTH EVERY DAY  . [  DISCONTINUED] fludrocortisone (FLORINEF) 0.1 MG tablet TAKE ONE (1) TABLET BY MOUTH EVERY DAY   No facility-administered encounter medications on file as of 11/28/2019.    ALLERGIES: Allergies  Allergen Reactions  . Bee Venom     VACCINATION STATUS:  There is no immunization history on file for this patient.  Diabetes He presents for his follow-up diabetic visit. He has type 2 diabetes mellitus. Onset time: He diagnosed at age of 54 years. His disease course has been worsening. There are no hypoglycemic associated symptoms. Pertinent negatives for hypoglycemia include no headaches, seizures or tremors. Pertinent negatives for diabetes include no blurred vision, no chest pain, no foot ulcerations, no polydipsia, no polyuria and no weight  loss. There are no hypoglycemic complications. Symptoms are worsening. There are no diabetic complications. Risk factors for coronary artery disease include diabetes mellitus, dyslipidemia, male sex, obesity, hypertension, sedentary lifestyle, tobacco exposure and family history. When asked about current treatments, none were reported. His weight is increasing steadily. He is following a generally unhealthy diet. When asked about meal planning, he reported none. He has not had a previous visit with a dietitian. He rarely participates in exercise. ( He did not bring any logs nor meter with him.  His point-of-care A1c is 8.4% increasing from 7.9%.  He denies hypoglycemia.  ) An ACE inhibitor/angiotensin II receptor blocker is not being taken. He does not see a podiatrist.Eye exam is not current.  Hyperlipidemia This is a chronic problem. The current episode started more than 1 year ago. The problem is uncontrolled. Exacerbating diseases include diabetes, hypothyroidism and obesity. Factors aggravating his hyperlipidemia include smoking. Pertinent negatives include no chest pain, myalgias or shortness of breath. Risk factors for coronary artery disease include dyslipidemia, diabetes mellitus, family history, obesity, male sex, hypertension and a sedentary lifestyle.  Hypertension This is a chronic problem. The current episode started more than 1 year ago. The problem is unchanged. Pertinent negatives include no blurred vision, chest pain, headaches, palpitations or shortness of breath. Risk factors for coronary artery disease include diabetes mellitus, dyslipidemia, family history, obesity, male gender, sedentary lifestyle and smoking/tobacco exposure. Past treatments include nothing.     Objective:    BP (!) 128/96   Pulse 76   Ht _0  (1.803 m)   Wt 278 lb 6.4 oz (126.3 kg)   BMI 38.83 kg/m   Wt Readings from Last 3 Encounters:  11/28/19 278 lb 6.4 oz (126.3 kg)  02/15/18 268 lb (121.6 kg)   10/02/17 266 lb (120.7 kg)     Recent Results (from the past 2160 hour(s))  Comprehensive metabolic panel     Status: Abnormal   Collection Time: 11/22/19 10:05 AM  Result Value Ref Range   Glucose 166 (H) 65 - 99 mg/dL   BUN 9 6 - 24 mg/dL   Creatinine, Ser 0.92 0.76 - 1.27 mg/dL   GFR calc non Af Amer 94 >59 mL/min/1.73   GFR calc Af Amer 109 >59 mL/min/1.73    Comment: **[facility] currently reports eGFR in compliance with the current**   recommendations of the [employer]. [facility] will   update reporting as new guidelines are published from the NKF-ASN   Task force.    BUN/Creatinine Ratio 10 9 - 20   Sodium 139 134 - 144 mmol/L   Potassium 4.4 3.5 - 5.2 mmol/L   Chloride 100 96 - 106 mmol/L   CO2 23 20 - 29 mmol/L   Calcium 9.4 8.7 - 10.2 mg/dL  Total Protein 7.1 6.0 - 8.5 g/dL   Albumin 4.7 3.8 - 4.9 g/dL   Globulin, Total 2.4 1.5 - 4.5 g/dL   Albumin/Globulin Ratio 2.0 1.2 - 2.2   Bilirubin Total 0.6 0.0 - 1.2 mg/dL   Alkaline Phosphatase 86 48 - 121 IU/L   AST 34 0 - 40 IU/L   ALT 43 0 - 44 IU/L  T4, free     Status: None   Collection Time: 11/22/19 10:05 AM  Result Value Ref Range   Free T4 1.27 0.82 - 1.77 ng/dL  TSH     Status: Abnormal   Collection Time: 11/22/19 10:05 AM  Result Value Ref Range   TSH 12.000 (H) 0.450 - 4.500 uIU/mL  VITAMIN D 25 Hydroxy (Vit-D Deficiency, Fractures)     Status: Abnormal   Collection Time: 11/22/19 10:05 AM  Result Value Ref Range   Vit D, 25-Hydroxy 20.9 (L) 30.0 - 100.0 ng/mL    Comment: Vitamin D deficiency has been defined by the [place] practice guideline as a level of serum 25-OH vitamin D less than 20 ng/mL (1,2). The Endocrine Society went on to further define vitamin D insufficiency as a level between 21 and 29 ng/mL (2). 1. IOM (Institute of Medicine). 2010. Dietary reference    intakes for calcium and D. [place]: The    [employer]. 2.  Holick MF, Binkley , Bischoff-Ferrari HA, et al.    Evaluation, treatment, and prevention of vitamin D    deficiency: an Endocrine Society clinical practice    guideline. JCEM. 2011 Jul; 96(7):1911-30.   Microalbumin, urine     Status: None   Collection Time: 11/22/19 10:05 AM  Result Value Ref Range   Microalbumin, Urine 18.5 Not Estab. ug/mL  HgB A1c     Status: Abnormal   Collection Time: 11/28/19  3:36 PM  Result Value Ref Range   Hemoglobin A1C 8.4 (A) 4.0 - 5.6 %   HbA1c POC (<> result, manual entry)     HbA1c, POC (prediabetic range)     HbA1c, POC (controlled diabetic range)     Lipid Panel     Component Value Date/Time   CHOL 176 09/29/2017 0910   TRIG 205 (H) 09/29/2017 0910   HDL 52 09/29/2017 0910   LDLCALC 83 09/29/2017 0910     Assessment & Plan:   1. Uncontrolled type 2 diabetes mellitus with hyperglycemia   - Jeffery Daniels has currently controlled asymptomatic type 2 DM since  54 years of age.  He did not bring any logs nor meter with him.  His point-of-care A1c is 8.4% increasing from 7.9%.  He denies hypoglycemia. -his diabetes is complicated by adrenal insufficiency on hydrocortisone, hypothyroidism, obesity/sedentary life, chronic heavy smoking and Jeffery Daniels remains at a high risk for more acute and chronic complications which include CAD, CVA, CKD, retinopathy, and neuropathy. These are all discussed in detail with the patient.  - I have counseled him on diet management and weight loss, by adopting a carbohydrate restricted/protein rich diet.  - he  admits there is a room for improvement in his diet and drink choices. -  Suggestion is made for him to avoid simple carbohydrates  from his diet including Cakes, Sweet Desserts / Pastries, Ice Cream, Soda (diet and regular), Sweet Tea, Candies, Chips, Cookies, Sweet Pastries,  Store Bought Juices, Alcohol in Excess of  1-2 drinks a day, Artificial Sweeteners, Coffee Creamer, and "Sugar-free" Products.  This will help patient to have stable blood glucose profile and potentially avoid unintended weight gain.   - I encouraged him to switch to  unprocessed or minimally processed complex starch and increased protein intake (animal or plant source), fruits, and vegetables.  - he is advised to stick to a routine mealtimes to eat 3 meals  a day and avoid unnecessary snacks ( to snack only to correct hypoglycemia).    - I have approached him with the following individualized plan to manage diabetes and patient agrees:   -Based on his presentation with loss of control, he will need additional treatment besides Metformin.   -He is advised to continue Metformin 1000 mg p.o. twice daily-after breakfast and after supper.  I discussed and added glipizide 5 mg p.o. daily at breakfast.  He is willing to monitor blood glucose twice a day-daily before breakfast and before bed, and return in 2 weeks for reevaluation.  - he continues to smoke heavily, not a suitable candidate for incretin therapy.  If he continues to lose control of diabetes, he will be considered for basal insulin.   - Patient specific target  A1c;  LDL, HDL, Triglycerides, were discussed in detail.  2) BP/HTN:   -His blood pressure is controlled to target.  He is tolerating hydrochlorothiazide.  He is advised to continue.  He was previously counseled for smoking cessation.    3) Lipids/HPL: His recent labs show improved LDL at 83, improving from 118.  He is advised to continue simvastatin 20 mg p.o. nightly.      Side effects are discussed with him.  4)  Weight/Diet: His BMI is 25.8-NIDPOEU complicating his diabetes care.  He is a candidate for modest weight loss.  CDE Consult has been  initiated , exercise, and detailed carbohydrates information provided.  5. Adrenal insufficiency ([place]) - The circumstances of his diagnosis 25 years ago are not available available to review. He reports Addison's Disease. He will be considered for work up  with anti 21 hydroxylase antibody . -He is on a stable dose of steroids.  He is advised to continue his current steroid replacement therapy including hydrocortisone 15 mg p.o. daily in the morning, and 10 mg daily at noon.   -He is advised to continue fludrocortisone 0.82m po qday.  -He is advised not to discontinue his steroid replacement without discussing with his providers.  6. Hypothyroidism -He reports some degree of inconsistency taking his thyroid hormones.  His previsit labs indicate under replacement, however he is on large dose of levothyroxine.    -I have advised him to be consistent and continue levothyroxine 225 mcg p.o. daily before breakfast.     - We discussed about the correct intake of his thyroid hormone, on empty stomach at fasting, with water, separated by at least 30 minutes from breakfast and other medications,  and separated by more than 4 hours from calcium, iron, multivitamins, acid reflux medications (PPIs). -Patient is made aware of the fact that thyroid hormone replacement is needed for life, dose to be adjusted by periodic monitoring of thyroid function tests.   7) Chronic Care/Health Maintenance:  -he  is not on  ACEI/ARB, is on  Statin medications and  is encouraged to continue to follow up with Ophthalmology, Dentist,  Podiatrist at least yearly or according to recommendations, and advised to  Quit smoking. I have recommended yearly flu vaccine and pneumonia vaccination at least every 5 years; moderate intensity exercise for up to 150 minutes weekly; and  sleep for at least 7 hours a day.   The patient was counseled on the dangers of tobacco use, and was advised to quit.  Reviewed strategies to maximize success, including removing cigarettes and smoking materials from environment.  - I advised patient to maintain close follow up with Jeffery Sites, MD for primary care needs.  - Time spent on this patient care encounter:  45 min, of which > 50% was spent in   counseling and the rest  discussing his hypoglycemia and hyperglycemia episodes, reviewing his current and  previous labs / studies  ( including abstraction from other facilities) and medications  doses and developing a  long term treatment plan and documenting his care.   Please refer to Patient Instructions for Blood Glucose Monitoring and Insulin/Medications Dosing Guide"  in media tab for additional information. Please  also refer to " Patient Self Inventory" in the Media  tab for reviewed elements of pertinent patient history.  Jeffery Daniels participated in the discussions, expressed understanding, and voiced agreement with the above plans.  All questions were answered to his satisfaction. he is encouraged to contact clinic should he have any questions or concerns prior to his return visit.   Follow up plan: - Return in about 2 weeks (around 12/12/2019) for F/U with Meter and Logs Only - no Labs.  Jeffery Lloyd, MD [facility] [address] [place], [place] [address] Phone: [phone]  Fax: [phone]    11/28/2019, 6:40 PM  This note was partially dictated with voice recognition software. Similar sounding words can be transcribed inadequately or may not  be corrected upon review.

## 2019-11-28 NOTE — Patient Instructions (Signed)

## 2019-12-02 ENCOUNTER — Other Ambulatory Visit: Payer: Self-pay | Admitting: "Endocrinology

## 2019-12-12 ENCOUNTER — Encounter: Payer: Self-pay | Admitting: "Endocrinology

## 2019-12-12 ENCOUNTER — Other Ambulatory Visit: Payer: Self-pay

## 2019-12-12 ENCOUNTER — Ambulatory Visit (INDEPENDENT_AMBULATORY_CARE_PROVIDER_SITE_OTHER): Payer: BC Managed Care – PPO | Admitting: "Endocrinology

## 2019-12-12 VITALS — BP 120/82 | HR 68 | Ht 71.0 in | Wt 268.2 lb

## 2019-12-12 DIAGNOSIS — E559 Vitamin D deficiency, unspecified: Secondary | ICD-10-CM | POA: Diagnosis not present

## 2019-12-12 DIAGNOSIS — E039 Hypothyroidism, unspecified: Secondary | ICD-10-CM | POA: Diagnosis not present

## 2019-12-12 DIAGNOSIS — E782 Mixed hyperlipidemia: Secondary | ICD-10-CM | POA: Diagnosis not present

## 2019-12-12 DIAGNOSIS — E2740 Unspecified adrenocortical insufficiency: Secondary | ICD-10-CM

## 2019-12-12 DIAGNOSIS — I10 Essential (primary) hypertension: Secondary | ICD-10-CM

## 2019-12-12 DIAGNOSIS — E1165 Type 2 diabetes mellitus with hyperglycemia: Secondary | ICD-10-CM | POA: Diagnosis not present

## 2019-12-12 MED ORDER — GLUCOSE BLOOD VI STRP: ORAL_STRIP | 12 refills | Status: DC

## 2019-12-12 NOTE — Patient Instructions (Signed)

## 2019-12-12 NOTE — Progress Notes (Signed)
12/12/2019, 10:37 PM   Endocrinology follow-up note   Subjective:    Patient ID: Jeffery Daniels, male    DOB: 19-Apr-1966.  Jeffery Daniels is being seen in follow-up for management of currently uncontrolled type 2 diabetes, adrenal insufficiency, hypothyroidism, hyperlipidemia, hypertension.    PMD:   Sharilyn Sites, MD.   Past Medical History:  Diagnosis Date  . Addison disease ([place])   . Hyperlipidemia    Past Surgical History:  Procedure Laterality Date  . HERNIA REPAIR     Social History   Socioeconomic History  . Marital status: Single    Spouse name: Not on file  . Number of children: Not on file  . Years of education: Not on file  . Highest education level: Not on file  Occupational History  . Not on file  Tobacco Use  . Smoking status: Current Every Day Smoker    Packs/day: 1.50    Years: 25.00    Pack years: 37.50    Types: Cigarettes  . Smokeless tobacco: Never Used  Vaping Use  . Vaping Use: Never used  Substance and Sexual Activity  . Alcohol use: Yes    Comment: Occasional  . Drug use: Never  . Sexual activity: Never  Other Topics Concern  . Not on file  Social History Narrative  . Not on file   Social Determinants of Health   Financial Resource Strain:   . Difficulty of Paying Living Expenses: Not on file  Food Insecurity:   . Worried About Charity fundraiser in the Last Year: Not on file  . Ran Out of Food in the Last Year: Not on file  Transportation Needs:   . Lack of Transportation (Medical): Not on file  . Lack of Transportation (Non-Medical): Not on file  Physical Activity:   . Days of Exercise per Week: Not on file  . Minutes of Exercise per Session: Not on file  Stress:   . Feeling of Stress : Not on file  Social Connections:   . Frequency of Communication with Friends and Family: Not on file  . Frequency of Social Gatherings with Friends and Family: Not on file  . Attends Religious Services: Not on  file  . Active Member of Clubs or Organizations: Not on file  . Attends Archivist Meetings: Not on file  . Marital Status: Not on file   Outpatient Encounter Medications as of 12/12/2019  Medication Sig  . Cholecalciferol (VITAMIN D3) 125 MCG (5000 UT) CAPS Take 1 capsule (5,000 Units total) by mouth daily.  . fludrocortisone (FLORINEF) 0.1 MG tablet TAKE ONE (1) TABLET BY MOUTH EVERY DAY  . glipiZIDE (GLUCOTROL XL) 5 MG 24 hr tablet Take 1 tablet (5 mg total) by mouth daily with breakfast.  . glucose blood test strip Use as instructed  . hydrochlorothiazide (HYDRODIURIL) 25 MG tablet TAKE ONE (1) TABLET BY MOUTH EVERY DAY  . hydrocortisone (CORTEF) 10 MG tablet TAKE ONE AND ONE-HALF TABLET (15MG TOTAL) EVERY MORNING AND ONE TABLET (10MG TOTAL) EVERYDAY AT NOON  . LANCETS ULTRA FINE MISC 1 each by Does not apply route 4 (four) times daily.  Marland Kitchen levothyroxine (SYNTHROID) 200 MCG tablet TAKE ONE TABLET (200MG TOTAL) BY MOUTH DAILY BEFORE BREAKFAST TAKE ALONG WITH 25MCG TO GET A TOTAL OF 225MCG DAILY  . levothyroxine (SYNTHROID) 25 MCG tablet TAKE ONE TABLET (25MCG TOTAL) BY MOUTH DAILY BEFORE BREAKFAST  . metFORMIN (GLUCOPHAGE) 1000 MG tablet  TAKE ONE TABLET (1000MG) BY MOUTH TWICE DAILY WITH A MEAL  . simvastatin (ZOCOR) 20 MG tablet TAKE ONE (1) TABLET BY MOUTH EVERY DAY  . [DISCONTINUED] glucose blood (ONETOUCH VERIO) test strip Use as instructed   No facility-administered encounter medications on file as of 12/12/2019.    ALLERGIES: Allergies  Allergen Reactions  . Bee Venom     VACCINATION STATUS:  There is no immunization history on file for this patient.  Diabetes He presents for his follow-up diabetic visit. He has type 2 diabetes mellitus. Onset time: He diagnosed at age of 9 years. His disease course has been improving. There are no hypoglycemic associated symptoms. Pertinent negatives for hypoglycemia include no headaches, seizures or tremors. Pertinent negatives  for diabetes include no blurred vision, no chest pain, no foot ulcerations, no polydipsia, no polyuria and no weight loss. There are no hypoglycemic complications. Symptoms are improving. There are no diabetic complications. Risk factors for coronary artery disease include diabetes mellitus, dyslipidemia, male sex, obesity, hypertension, sedentary lifestyle, tobacco exposure and family history. When asked about current treatments, none were reported. His weight is decreasing steadily. He is following a generally unhealthy diet. When asked about meal planning, he reported none. He has not had a previous visit with a dietitian. He rarely participates in exercise. ( He made significant change in the last time, losing 10 pounds since last visit.  He presents with near target glycemic profile both fasting and postprandial.  His recent point-of-care A1c was 8.4% increasing from 7.9%.  He did not document hypoglycemia.  ) An ACE inhibitor/angiotensin II receptor blocker is not being taken. He does not see a podiatrist.Eye exam is not current.  Hyperlipidemia This is a chronic problem. The current episode started more than 1 year ago. The problem is uncontrolled. Exacerbating diseases include diabetes, hypothyroidism and obesity. Factors aggravating his hyperlipidemia include smoking. Pertinent negatives include no chest pain, myalgias or shortness of breath. Risk factors for coronary artery disease include dyslipidemia, diabetes mellitus, family history, obesity, male sex, hypertension and a sedentary lifestyle.  Hypertension This is a chronic problem. The current episode started more than 1 year ago. The problem is unchanged. Pertinent negatives include no blurred vision, chest pain, headaches, palpitations or shortness of breath. Risk factors for coronary artery disease include diabetes mellitus, dyslipidemia, family history, obesity, male gender, sedentary lifestyle and smoking/tobacco exposure. Past treatments  include nothing.     Objective:    BP 120/82   Pulse 68   Ht _0  (1.803 m)   Wt 268 lb 3.2 oz (121.7 kg)   BMI 37.41 kg/m   Wt Readings from Last 3 Encounters:  12/12/19 268 lb 3.2 oz (121.7 kg)  11/28/19 278 lb 6.4 oz (126.3 kg)  02/15/18 268 lb (121.6 kg)     Recent Results (from the past 2160 hour(s))  Comprehensive metabolic panel     Status: Abnormal   Collection Time: 11/22/19 10:05 AM  Result Value Ref Range   Glucose 166 (H) 65 - 99 mg/dL   BUN 9 6 - 24 mg/dL   Creatinine, Ser 0.92 0.76 - 1.27 mg/dL   GFR calc non Af Amer 94 >59 mL/min/1.73   GFR calc Af Amer 109 >59 mL/min/1.73    Comment: **[facility] currently reports eGFR in compliance with the current**   recommendations of the [employer]. [facility] will   update reporting as new guidelines are published from the NKF-ASN   Task force.    BUN/Creatinine Ratio 10  9 - 20   Sodium 139 134 - 144 mmol/L   Potassium 4.4 3.5 - 5.2 mmol/L   Chloride 100 96 - 106 mmol/L   CO2 23 20 - 29 mmol/L   Calcium 9.4 8.7 - 10.2 mg/dL   Total Protein 7.1 6.0 - 8.5 g/dL   Albumin 4.7 3.8 - 4.9 g/dL   Globulin, Total 2.4 1.5 - 4.5 g/dL   Albumin/Globulin Ratio 2.0 1.2 - 2.2   Bilirubin Total 0.6 0.0 - 1.2 mg/dL   Alkaline Phosphatase 86 48 - 121 IU/L   AST 34 0 - 40 IU/L   ALT 43 0 - 44 IU/L  T4, free     Status: None   Collection Time: 11/22/19 10:05 AM  Result Value Ref Range   Free T4 1.27 0.82 - 1.77 ng/dL  TSH     Status: Abnormal   Collection Time: 11/22/19 10:05 AM  Result Value Ref Range   TSH 12.000 (H) 0.450 - 4.500 uIU/mL  VITAMIN D 25 Hydroxy (Vit-D Deficiency, Fractures)     Status: Abnormal   Collection Time: 11/22/19 10:05 AM  Result Value Ref Range   Vit D, 25-Hydroxy 20.9 (L) 30.0 - 100.0 ng/mL    Comment: Vitamin D deficiency has been defined by the [place] and an Endocrine Society practice guideline as a level of serum 25-OH vitamin D less than 20 ng/mL (1,2). The  Endocrine Society went on to further define vitamin D insufficiency as a level between 21 and 29 ng/mL (2). 1. IOM (Institute of Medicine). 2010. Dietary reference    intakes for calcium and D. [place]: The    [employer]. 2. Holick MF, Binkley Horine, Bischoff-Ferrari HA, et al.    Evaluation, treatment, and prevention of vitamin D    deficiency: an Endocrine Society clinical practice    guideline. JCEM. 2011 Jul; 96(7):1911-30.   Microalbumin, urine     Status: None   Collection Time: 11/22/19 10:05 AM  Result Value Ref Range   Microalbumin, Urine 18.5 Not Estab. ug/mL  HgB A1c     Status: Abnormal   Collection Time: 11/28/19  3:36 PM  Result Value Ref Range   Hemoglobin A1C 8.4 (A) 4.0 - 5.6 %   HbA1c POC (<> result, manual entry)     HbA1c, POC (prediabetic range)     HbA1c, POC (controlled diabetic range)     Lipid Panel     Component Value Date/Time   CHOL 176 09/29/2017 0910   TRIG 205 (H) 09/29/2017 0910   HDL 52 09/29/2017 0910   LDLCALC 83 09/29/2017 0910     Assessment & Plan:   1. Uncontrolled type 2 diabetes mellitus with hyperglycemia   - Jeffery Daniels has currently controlled asymptomatic type 2 DM since  54 years of age.  He made significant change in the last time, losing 10 pounds since last visit.  He presents with near target glycemic profile both fasting and postprandial.  His recent point-of-care A1c was 8.4% increasing from 7.9%.  He did not document hypoglycemia.  -his diabetes is complicated by adrenal insufficiency on hydrocortisone, hypothyroidism, obesity/sedentary life, chronic heavy smoking and Jeffery Daniels remains at a high risk for more acute and chronic complications which include CAD, CVA, CKD, retinopathy, and neuropathy. These are all discussed in detail with the patient.  - I have counseled him on diet management and weight loss, by adopting a carbohydrate restricted/protein rich diet.   - he  admits  there is a room  for improvement in his diet and drink choices. -  Suggestion is made for him to avoid simple carbohydrates  from his diet including Cakes, Sweet Desserts / Pastries, Ice Cream, Soda (diet and regular), Sweet Tea, Candies, Chips, Cookies, Sweet Pastries,  Store Bought Juices, Alcohol in Excess of  1-2 drinks a day, Artificial Sweeteners, Coffee Creamer, and "Sugar-free" Products. This will help patient to have stable blood glucose profile and potentially avoid unintended weight gain.   - I encouraged him to switch to  unprocessed or minimally processed complex starch and increased protein intake (animal or plant source), fruits, and vegetables.  - he is advised to stick to a routine mealtimes to eat 3 meals  a day and avoid unnecessary snacks ( to snack only to correct hypoglycemia).    - I have approached him with the following individualized plan to manage diabetes and patient agrees:   -Based on his presentation with near target glycemic profile, he will not need insulin treatment at this time.     -He is advised to continue Metformin 1000 mg p.o. twice daily-after breakfast and after supper.  He has benefited from glipizide.  He is advised to continue glipizide 5 mg XL p.o. daily after breakfast.  He is willing to continue to monitor blood glucose-twice a day before breakfast and at bedtime.    - he continues to smoke heavily, not a suitable candidate for incretin therapy.  If he continues to lose control of diabetes, he will be considered for basal insulin.   - Patient specific target  A1c;  LDL, HDL, Triglycerides, were discussed in detail.  2) BP/HTN:   His blood pressure is controlled to target.  He is tolerating hydrochlorothiazide.  He is advised to continue.  He was previously counseled for smoking cessation.    3) Lipids/HPL: His recent labs show improved LDL at 83, improving from 118.  He is advised to continue simvastatin 20 mg p.o. nightly.        Side effects are discussed with  him.  4)  Weight/Diet: His BMI is 37.1-Jeffery Daniels complicating his diabetes care.  He is a candidate for modest weight loss.  CDE Consult has been  initiated , exercise, and detailed carbohydrates information provided.  5. Adrenal insufficiency ([place]) - The circumstances of his diagnosis 25 years ago are not available available to review. He reports Addison's Disease. He will be considered for work up with anti 21 hydroxylase antibody . -He is on a stable dose of steroids.  He is advised to continue his current steroid replacement therapy including hydrocortisone 15 mg p.o. daily in the morning, and 10 mg daily at noon.   -He is advised to continue fludrocortisone 0.67m po qday.  -He is advised not to discontinue his steroid replacement without discussing with his providers.  6. Hypothyroidism -He reports some degree of inconsistency taking his thyroid hormones.  His previsit labs indicate under replacement, however he is on large dose of levothyroxine.    -I have advised him to be consistent and continue levothyroxine 225 mcg p.o. daily before breakfast.    - We discussed about the correct intake of his thyroid hormone, on empty stomach at fasting, with water, separated by at least 30 minutes from breakfast and other medications,  and separated by more than 4 hours from calcium, iron, multivitamins, acid reflux medications (PPIs). -Patient is made aware of the fact that thyroid hormone replacement is needed for life, dose to be adjusted  by periodic monitoring of thyroid function tests.    7) Chronic Care/Health Maintenance:  -he  is not on  ACEI/ARB, is on  Statin medications and  is encouraged to continue to follow up with Ophthalmology, Dentist,  Podiatrist at least yearly or according to recommendations, and advised to  Quit smoking. I have recommended yearly flu vaccine and pneumonia vaccination at least every 5 years; moderate intensity exercise for up to 150 minutes weekly; and  sleep for at  least 7 hours a day.   The patient was counseled on the dangers of tobacco use, and was advised to quit.  Reviewed strategies to maximize success, including removing cigarettes and smoking materials from environment.  - I advised patient to maintain close follow up with Sharilyn Sites, MD for primary care needs.   - Time spent on this patient care encounter:  40 min, of which > 50% was spent in  counseling and the rest reviewing his blood glucose logs , discussing his hypoglycemia and hyperglycemia episodes, reviewing his current and  previous labs / studies  ( including abstraction from other facilities) and medications  doses and developing a  long term treatment plan and documenting his care.   Please refer to Patient Instructions for Blood Glucose Monitoring and Insulin/Medications Dosing Guide"  in media tab for additional information. Please  also refer to " Patient Self Inventory" in the Media  tab for reviewed elements of pertinent patient history.  Kathrin Penner participated in the discussions, expressed understanding, and voiced agreement with the above plans.  All questions were answered to his satisfaction. he is encouraged to contact clinic should he have any questions or concerns prior to his return visit.   Follow up plan: - Return in about 3 months (around 03/13/2020) for F/U with Pre-visit Labs, Meter, Logs, A1c here.Glade Lloyd, MD [facility] [address] [place], [place] [address] Phone: [phone]  Fax: [phone]    12/12/2019, 10:37 PM  This note was partially dictated with voice recognition software. Similar sounding words can be transcribed inadequately or may not  be corrected upon review.

## 2020-02-03 ENCOUNTER — Other Ambulatory Visit: Payer: Self-pay | Admitting: "Endocrinology

## 2020-02-06 ENCOUNTER — Other Ambulatory Visit: Payer: Self-pay | Admitting: "Endocrinology

## 2020-02-10 ENCOUNTER — Other Ambulatory Visit: Payer: Self-pay | Admitting: "Endocrinology

## 2020-03-13 DIAGNOSIS — R7309 Other abnormal glucose: Secondary | ICD-10-CM | POA: Diagnosis not present

## 2020-03-13 DIAGNOSIS — E1165 Type 2 diabetes mellitus with hyperglycemia: Secondary | ICD-10-CM | POA: Diagnosis not present

## 2020-03-13 DIAGNOSIS — E039 Hypothyroidism, unspecified: Secondary | ICD-10-CM | POA: Diagnosis not present

## 2020-03-14 LAB — COMPREHENSIVE METABOLIC PANEL
ALT: 29 IU/L (ref 0–44)
AST: 22 IU/L (ref 0–40)
Albumin/Globulin Ratio: 2.4 — ABNORMAL HIGH (ref 1.2–2.2)
Albumin: 4.6 g/dL (ref 3.8–4.9)
Alkaline Phosphatase: 73 IU/L (ref 44–121)
BUN/Creatinine Ratio: 13 (ref 9–20)
BUN: 11 mg/dL (ref 6–24)
Bilirubin Total: 0.2 mg/dL (ref 0.0–1.2)
CO2: 23 mmol/L (ref 20–29)
Calcium: 9.3 mg/dL (ref 8.7–10.2)
Chloride: 104 mmol/L (ref 96–106)
Creatinine, Ser: 0.84 mg/dL (ref 0.76–1.27)
GFR calc Af Amer: 115 mL/min/{1.73_m2} (ref >59)
GFR calc non Af Amer: 99 mL/min/{1.73_m2} (ref >59)
Globulin, Total: 1.9 g/dL (ref 1.5–4.5)
Glucose: 120 mg/dL — ABNORMAL HIGH (ref 65–99)
Potassium: 4.4 mmol/L (ref 3.5–5.2)
Sodium: 140 mmol/L (ref 134–144)
Total Protein: 6.5 g/dL (ref 6.0–8.5)

## 2020-03-14 LAB — HEMOGLOBIN A1C
Est. average glucose Bld gHb Est-mCnc: 137 mg/dL
Hgb A1c MFr Bld: 6.4 % — ABNORMAL HIGH (ref 4.8–5.6)

## 2020-03-14 LAB — TSH: TSH: 1.33 u[IU]/mL (ref 0.450–4.500)

## 2020-03-14 LAB — T4, FREE: Free T4: 1.4 ng/dL (ref 0.82–1.77)

## 2020-03-17 ENCOUNTER — Other Ambulatory Visit: Payer: Self-pay

## 2020-03-17 ENCOUNTER — Encounter: Payer: Self-pay | Admitting: Nurse Practitioner

## 2020-03-17 ENCOUNTER — Ambulatory Visit: Payer: BC Managed Care – PPO | Admitting: Nurse Practitioner

## 2020-03-17 VITALS — BP 152/90 | HR 82 | Ht 71.0 in | Wt 274.0 lb

## 2020-03-17 DIAGNOSIS — E559 Vitamin D deficiency, unspecified: Secondary | ICD-10-CM | POA: Diagnosis not present

## 2020-03-17 DIAGNOSIS — I10 Essential (primary) hypertension: Secondary | ICD-10-CM

## 2020-03-17 DIAGNOSIS — E782 Mixed hyperlipidemia: Secondary | ICD-10-CM | POA: Diagnosis not present

## 2020-03-17 DIAGNOSIS — E1165 Type 2 diabetes mellitus with hyperglycemia: Secondary | ICD-10-CM | POA: Diagnosis not present

## 2020-03-17 DIAGNOSIS — E039 Hypothyroidism, unspecified: Secondary | ICD-10-CM

## 2020-03-17 DIAGNOSIS — E2740 Unspecified adrenocortical insufficiency: Secondary | ICD-10-CM

## 2020-03-17 MED ORDER — LEVOTHYROXINE SODIUM 25 MCG PO TABS: ORAL_TABLET | ORAL | 3 refills | Status: DC

## 2020-03-17 MED ORDER — METFORMIN HCL 1000 MG PO TABS: ORAL_TABLET | ORAL | 3 refills | Status: DC

## 2020-03-17 MED ORDER — LEVOTHYROXINE SODIUM 200 MCG PO TABS: 200.0000 ug | ORAL_TABLET | Freq: Every day | ORAL | 3 refills | Status: DC

## 2020-03-17 MED ORDER — GLIPIZIDE ER 5 MG PO TB24: 5.0000 mg | ORAL_TABLET | Freq: Every day | ORAL | 3 refills | Status: DC

## 2020-03-17 MED ORDER — GLUCOSE BLOOD VI STRP: ORAL_STRIP | 12 refills | Status: DC

## 2020-03-17 NOTE — Progress Notes (Signed)
03/17/2020, 4:43 PM   Endocrinology follow-up note   Subjective:    Patient ID: Jeffery Daniels, male    DOB: 1965/08/27.  Jeffery Daniels is being seen in follow-up for management of currently uncontrolled type 2 diabetes, adrenal insufficiency, hypothyroidism, hyperlipidemia, hypertension.    PMD:   Sharilyn Sites, MD.   Past Medical History:  Diagnosis Date  . Addison disease ([place])   . Hyperlipidemia    Past Surgical History:  Procedure Laterality Date  . HERNIA REPAIR     Social History   Socioeconomic History  . Marital status: Single    Spouse name: Not on file  . Number of children: Not on file  . Years of education: Not on file  . Highest education level: Not on file  Occupational History  . Not on file  Tobacco Use  . Smoking status: Current Every Day Smoker    Packs/day: 1.50    Years: 25.00    Pack years: 37.50    Types: Cigarettes  . Smokeless tobacco: Never Used  Vaping Use  . Vaping Use: Never used  Substance and Sexual Activity  . Alcohol use: Yes    Comment: Occasional  . Drug use: Never  . Sexual activity: Never  Other Topics Concern  . Not on file  Social History Narrative  . Not on file   Social Determinants of Health   Financial Resource Strain:   . Difficulty of Paying Living Expenses: Not on file  Food Insecurity:   . Worried About Charity fundraiser in the Last Year: Not on file  . Ran Out of Food in the Last Year: Not on file  Transportation Needs:   . Lack of Transportation (Medical): Not on file  . Lack of Transportation (Non-Medical): Not on file  Physical Activity:   . Days of Exercise per Week: Not on file  . Minutes of Exercise per Session: Not on file  Stress:   . Feeling of Stress : Not on file  Social Connections:   . Frequency of Communication with Friends and Family: Not on file  . Frequency of Social Gatherings with Friends and Family: Not on file  . Attends Religious Services: Not on  file  . Active Member of Clubs or Organizations: Not on file  . Attends Archivist Meetings: Not on file  . Marital Status: Not on file   Outpatient Encounter Medications as of 03/17/2020  Medication Sig  . Cholecalciferol (VITAMIN D3) 125 MCG (5000 UT) CAPS Take 1 capsule (5,000 Units total) by mouth daily.  . fludrocortisone (FLORINEF) 0.1 MG tablet TAKE ONE (1) TABLET BY MOUTH EVERY DAY  . glipiZIDE (GLUCOTROL XL) 5 MG 24 hr tablet Take 1 tablet (5 mg total) by mouth daily with breakfast.  . glucose blood test strip Use as instructed  . hydrochlorothiazide (HYDRODIURIL) 25 MG tablet TAKE ONE (1) TABLET BY MOUTH EVERY DAY  . hydrocortisone (CORTEF) 10 MG tablet TAKE ONE AND ONE-HALF TABLET ($RemoveBeforeDE'15MG'mTBOtlRzaRczgwt$  TOTAL) EVERY MORNING AND ONE TABLET ($RemoveBef'10MG'ZKGntcSTjN$  TOTAL) EVERYDAY AT NOON  . LANCETS ULTRA FINE MISC 1 each by Does not apply route 4 (four) times daily.  Marland Kitchen levothyroxine (SYNTHROID) 200 MCG tablet Take 1 tablet (200 mcg total) by mouth daily before breakfast.  . levothyroxine (SYNTHROID) 25 MCG tablet TAKE ONE TABLET (25MCG TOTAL) BY MOUTH DAILY BEFORE BREAKFAST  . metFORMIN (GLUCOPHAGE) 1000 MG tablet TAKE ONE TABLET ($RemoveBef'1000MG'safoJgbSJK$ ) BY MOUTH TWICE DAILY WITH A  MEAL  . simvastatin (ZOCOR) 20 MG tablet TAKE ONE (1) TABLET BY MOUTH EVERY DAY  . [DISCONTINUED] glipiZIDE (GLUCOTROL XL) 5 MG 24 hr tablet Take 1 tablet (5 mg total) by mouth daily with breakfast.  . [DISCONTINUED] glucose blood test strip Use as instructed  . [DISCONTINUED] levothyroxine (SYNTHROID) 200 MCG tablet TAKE ONE TABLET ($RemoveBef'200MG'WaBqFQFzFc$  TOTAL) BY MOUTH DAILY BEFORE BREAKFAST TAKE ALONG WITH 25MCG TO GET A TOTAL OF 225MCG DAILY  . [DISCONTINUED] levothyroxine (SYNTHROID) 25 MCG tablet TAKE ONE TABLET (25MCG TOTAL) BY MOUTH DAILY BEFORE BREAKFAST  . [DISCONTINUED] metFORMIN (GLUCOPHAGE) 1000 MG tablet TAKE ONE TABLET ($RemoveBef'1000MG'izXMzBOjhe$ ) BY MOUTH TWICE DAILY WITH A MEAL   No facility-administered encounter medications on file as of 03/17/2020.     ALLERGIES: Allergies  Allergen Reactions  . Bee Venom     VACCINATION STATUS:  There is no immunization history on file for this patient.  Diabetes He presents for his follow-up diabetic visit. He has type 2 diabetes mellitus. Onset time: He diagnosed at age of 18 years. His disease course has been improving. There are no hypoglycemic associated symptoms. Pertinent negatives for hypoglycemia include no headaches, seizures or tremors. Pertinent negatives for diabetes include no blurred vision, no chest pain, no foot ulcerations, no polydipsia, no polyuria and no weight loss. There are no hypoglycemic complications. Symptoms are improving. There are no diabetic complications. Risk factors for coronary artery disease include diabetes mellitus, dyslipidemia, male sex, obesity, hypertension, sedentary lifestyle, tobacco exposure and family history. Current diabetic treatment includes oral agent (dual therapy). He is compliant with treatment most of the time. His weight is increasing steadily. He is following a generally healthy diet. When asked about meal planning, he reported none. He has not had a previous visit with a dietitian. He rarely participates in exercise. His home blood glucose trend is decreasing steadily. (He presents today with no meter or logs to review.  He admits to not monitoring his glucose like he should.  His previsit A1c was 6.4% on 03/13/20, greatly improving from previous visit of 8.4%.  He denies any s/s of hypoglycemia.  He has worked on his diet and has increased his exercise. ) An ACE inhibitor/angiotensin II receptor blocker is not being taken. He does not see a podiatrist.Eye exam is not current.  Hyperlipidemia This is a chronic problem. The current episode started more than 1 year ago. The problem is uncontrolled. Recent lipid tests were reviewed and are variable. Exacerbating diseases include diabetes, hypothyroidism and obesity. Factors aggravating his hyperlipidemia  include smoking and thiazides. Pertinent negatives include no chest pain, myalgias or shortness of breath. Current antihyperlipidemic treatment includes statins. The current treatment provides mild improvement of lipids. Compliance problems include adherence to diet and adherence to exercise.  Risk factors for coronary artery disease include dyslipidemia, diabetes mellitus, family history, obesity, male sex, hypertension and a sedentary lifestyle.  Hypertension This is a chronic problem. The current episode started more than 1 year ago. The problem has been waxing and waning since onset. The problem is uncontrolled. Pertinent negatives include no blurred vision, chest pain, headaches, palpitations or shortness of breath. Agents associated with hypertension include thyroid hormones. Risk factors for coronary artery disease include diabetes mellitus, dyslipidemia, family history, obesity, male gender, sedentary lifestyle and smoking/tobacco exposure. Past treatments include diuretics. The current treatment provides mild improvement. There are no compliance problems.  Identifiable causes of hypertension include a thyroid problem.     Objective:    BP (!) 152/90   Pulse  82   Ht $R'5\' 11"'OD$  (1.803 m)   Wt 274 lb (124.3 kg)   BMI 38.22 kg/m   Wt Readings from Last 3 Encounters:  03/17/20 274 lb (124.3 kg)  12/12/19 268 lb 3.2 oz (121.7 kg)  11/28/19 278 lb 6.4 oz (126.3 kg)     BP Readings from Last 3 Encounters:  03/17/20 (!) 152/90  12/12/19 120/82  11/28/19 (!) 128/96     Physical Exam- Limited  Constitutional:  Body mass index is 38.22 kg/m. , not in acute distress, normal state of mind Eyes:  EOMI, no exophthalmos Neck: Supple Thyroid: No gross goiter Cardiovascular: RRR, no murmers, rubs, or gallops, no edema Respiratory: Adequate breathing efforts, no crackles, rales, rhonchi, or wheezing Musculoskeletal: no gross deformities, strength intact in all four extremities, no gross  restriction of joint movements Skin:  no rashes, no hyperemia Neurological: no tremor with outstretched hands    Recent Results (from the past 2160 hour(s))  Comprehensive metabolic panel     Status: Abnormal   Collection Time: 03/13/20  8:27 AM  Result Value Ref Range   Glucose 120 (H) 65 - 99 mg/dL   BUN 11 6 - 24 mg/dL   Creatinine, Ser 0.84 0.76 - 1.27 mg/dL   GFR calc non Af Amer 99 >59 mL/min/1.73   GFR calc Af Amer 115 >59 mL/min/1.73    Comment: **In accordance with recommendations from the NKF-ASN Task force,**   [facility] is in the process of updating its eGFR calculation to the   2021 CKD-EPI creatinine equation that estimates kidney function   without a race variable.    BUN/Creatinine Ratio 13 9 - 20   Sodium 140 134 - 144 mmol/L   Potassium 4.4 3.5 - 5.2 mmol/L   Chloride 104 96 - 106 mmol/L   CO2 23 20 - 29 mmol/L   Calcium 9.3 8.7 - 10.2 mg/dL   Total Protein 6.5 6.0 - 8.5 g/dL   Albumin 4.6 3.8 - 4.9 g/dL   Globulin, Total 1.9 1.5 - 4.5 g/dL   Albumin/Globulin Ratio 2.4 (H) 1.2 - 2.2   Bilirubin Total 0.2 0.0 - 1.2 mg/dL   Alkaline Phosphatase 73 44 - 121 IU/L    Comment:               **Please note reference interval change**   AST 22 0 - 40 IU/L   ALT 29 0 - 44 IU/L  TSH     Status: None   Collection Time: 03/13/20  8:27 AM  Result Value Ref Range   TSH 1.330 0.450 - 4.500 uIU/mL  T4, free     Status: None   Collection Time: 03/13/20  8:27 AM  Result Value Ref Range   Free T4 1.40 0.82 - 1.77 ng/dL  Hemoglobin A1c     Status: Abnormal   Collection Time: 03/13/20  8:27 AM  Result Value Ref Range   Hgb A1c MFr Bld 6.4 (H) 4.8 - 5.6 %    Comment:          Prediabetes: 5.7 - 6.4          Diabetes: >6.4          Glycemic control for adults with diabetes: <7.0    Est. average glucose Bld gHb Est-mCnc 137 mg/dL   Lipid Panel     Component Value Date/Time   CHOL 176 09/29/2017 0910   TRIG 205 (H) 09/29/2017 0910   HDL 52 09/29/2017 0910   LDLCALC 83  09/29/2017 0910     Assessment & Plan:   1. Uncontrolled type 2 diabetes mellitus with hyperglycemia   - Jeffery Daniels has currently controlled asymptomatic type 2 DM since  54 years of age.  He presents today with no meter or logs to review.  He admits to not monitoring his glucose like he should.  His previsit A1c was 6.4% on 03/13/20, greatly improving from previous visit of 8.4%.  He denies any s/s of hypoglycemia.  He has worked on his diet and has increased his exercise.  -his diabetes is complicated by adrenal insufficiency on hydrocortisone, hypothyroidism, obesity/sedentary life, chronic heavy smoking and SKYE RODARTE remains at a high risk for more acute and chronic complications which include CAD, CVA, CKD, retinopathy, and neuropathy. These are all discussed in detail with the patient.  - Nutritional counseling repeated at each appointment due to patients tendency to fall back in to old habits.  - The patient admits there is a room for improvement in their diet and drink choices. -  Suggestion is made for the patient to avoid simple carbohydrates from their diet including Cakes, Sweet Desserts / Pastries, Ice Cream, Soda (diet and regular), Sweet Tea, Candies, Chips, Cookies, Sweet Pastries,  Store Bought Juices, Alcohol in Excess of  1-2 drinks a day, Artificial Sweeteners, Coffee Creamer, and "Sugar-free" Products. This will help patient to have stable blood glucose profile and potentially avoid unintended weight gain.   - I encouraged the patient to switch to  unprocessed or minimally processed complex starch and increased protein intake (animal or plant source), fruits, and vegetables.   - Patient is advised to stick to a routine mealtimes to eat 3 meals  a day and avoid unnecessary snacks ( to snack only to correct hypoglycemia).  - I have approached him with the following individualized plan to manage diabetes and patient agrees:   -Based on his presentation with near  target glycemic profile, he will not need insulin treatment at this time.     -He is advised to continue Metformin 1000 mg p.o. twice daily with meals and continue Glipizide 5 mg XL daily with breakfast.   -He is encouraged to start monitoring blood glucose consistently, at least once daily, before breakfast, and call the clinic if he has readings less than 70 or greater than 200 for 3 tests in a row.  - he continues to smoke heavily, not a suitable candidate for incretin therapy.  If he continues to lose control of diabetes, he will be considered for basal insulin.   - Patient specific target  A1c;  LDL, HDL, Triglycerides, were discussed in detail.  2) BP/HTN:  His blood pressure is not controlled to target.  He just smoked a cigarette prior to coming in the office. He is advised to continue HCTZ 25 mg po daily.  Will consider adding ACE/ARB at next visit if BP continues to be elevated.  3) Lipids/HPL:  His most recent lipid panel from 09/29/17 shows controlled LDL of 83 and elevated triglycerides of 205.  He is advised to continue Simvastatin 20 mg po daily at bedtime.  Side effects and precautions discussed with him.  Will recheck lipid panel on subsequent visits.  4)  Weight/Diet:  His Body mass index is 38.22 kg/m.-clearly complicating his diabetes care.  He is a candidate for modest weight loss.  CDE Consult has been  initiated , exercise, and detailed carbohydrates information provided.  5) Adrenal insufficiency ([facility]CC) - The circumstances of  his diagnosis 25 years ago are not available available to review. He reports Addison's Disease. He will be considered for work up with anti 21 hydroxylase antibody . -He is on a stable dose of steroids.  He is advised to continue his current steroid replacement therapy including hydrocortisone 15 mg p.o. daily in the morning, and 10 mg daily at noon.   -He is advised to continue fludrocortisone 0.1mg  po qday.  -He is advised not to discontinue his  steroid replacement without discussing with his providers.  6) Hypothyroidism -His previsit TFTs are consistent with appropriate hormone replacement.  He is advised to continue Levothyroxine 225 mcg po daily before breakfast.  He has been more consistent with taking his medication appropriately since last visit.   - We discussed about the correct intake of his thyroid hormone, on empty stomach at fasting, with water, separated by at least 30 minutes from breakfast and other medications,  and separated by more than 4 hours from calcium, iron, multivitamins, acid reflux medications (PPIs). -Patient is made aware of the fact that thyroid hormone replacement is needed for life, dose to be adjusted by periodic monitoring of thyroid function tests.  7) Chronic Care/Health Maintenance: -he  is not on  ACEI/ARB, is on Statin medications and is encouraged to continue to follow up with Ophthalmology, Dentist,  Podiatrist at least yearly or according to recommendations, and advised to  Quit smoking. I have recommended yearly flu vaccine and pneumonia vaccination at least every 5 years; moderate intensity exercise for up to 150 minutes weekly; and  sleep for at least 7 hours a day.  The patient was counseled on the dangers of tobacco use, and was advised to quit.  Reviewed strategies to maximize success, including removing cigarettes and smoking materials from environment.  - I advised patient to maintain close follow up with Sharilyn Sites, MD for primary care needs.   - Time spent on this patient care encounter:  35 min, of which > 50% was spent in  counseling and the rest reviewing his blood glucose logs , discussing his hypoglycemia and hyperglycemia episodes, reviewing his current and  previous labs / studies  ( including abstraction from other facilities) and medications  doses and developing a  long term treatment plan and documenting his care.   Please refer to Patient Instructions for Blood Glucose  Monitoring and Insulin/Medications Dosing Guide"  in media tab for additional information. Please  also refer to " Patient Self Inventory" in the Media  tab for reviewed elements of pertinent patient history.  Kathrin Penner participated in the discussions, expressed understanding, and voiced agreement with the above plans.  All questions were answered to his satisfaction. he is encouraged to contact clinic should he have any questions or concerns prior to his return visit.   Follow up plan: - Return in about 4 months (around 07/15/2020) for Diabetes follow up with A1c in office, No previsit labs, Bring glucometer and logs, ABI next visit.  Rayetta Pigg, [facility] [address] [place], [place] [address] Phone: [phone] Fax: [phone]   03/17/2020, 4:43 PM

## 2020-03-17 NOTE — Patient Instructions (Signed)

## 2020-05-06 DIAGNOSIS — U071 COVID-19: Secondary | ICD-10-CM | POA: Diagnosis not present

## 2020-05-09 ENCOUNTER — Other Ambulatory Visit: Payer: Self-pay | Admitting: "Endocrinology

## 2020-05-09 DIAGNOSIS — E782 Mixed hyperlipidemia: Secondary | ICD-10-CM

## 2020-06-01 ENCOUNTER — Other Ambulatory Visit: Payer: Self-pay | Admitting: "Endocrinology

## 2020-07-15 ENCOUNTER — Ambulatory Visit: Payer: BC Managed Care – PPO | Admitting: Nurse Practitioner

## 2020-07-15 NOTE — Patient Instructions (Incomplete)

## 2020-07-23 ENCOUNTER — Ambulatory Visit: Payer: BC Managed Care – PPO | Admitting: Nurse Practitioner

## 2020-07-23 ENCOUNTER — Other Ambulatory Visit: Payer: Self-pay

## 2020-07-23 ENCOUNTER — Encounter: Payer: Self-pay | Admitting: Nurse Practitioner

## 2020-07-23 VITALS — BP 154/108 | HR 77 | Ht 71.0 in | Wt 263.0 lb

## 2020-07-23 DIAGNOSIS — I10 Essential (primary) hypertension: Secondary | ICD-10-CM

## 2020-07-23 DIAGNOSIS — E1165 Type 2 diabetes mellitus with hyperglycemia: Secondary | ICD-10-CM | POA: Diagnosis not present

## 2020-07-23 DIAGNOSIS — E782 Mixed hyperlipidemia: Secondary | ICD-10-CM | POA: Diagnosis not present

## 2020-07-23 DIAGNOSIS — E559 Vitamin D deficiency, unspecified: Secondary | ICD-10-CM | POA: Diagnosis not present

## 2020-07-23 DIAGNOSIS — E039 Hypothyroidism, unspecified: Secondary | ICD-10-CM | POA: Diagnosis not present

## 2020-07-23 DIAGNOSIS — E2740 Unspecified adrenocortical insufficiency: Secondary | ICD-10-CM

## 2020-07-23 LAB — POCT GLYCOSYLATED HEMOGLOBIN (HGB A1C): Hemoglobin A1C: 6.2 % — AB (ref 4.0–5.6)

## 2020-07-23 MED ORDER — LISINOPRIL 5 MG PO TABS: 5.0000 mg | ORAL_TABLET | Freq: Every day | ORAL | 3 refills | Status: DC

## 2020-07-23 MED ORDER — HYDROCORTISONE 10 MG PO TABS: 10.0000 mg | ORAL_TABLET | Freq: Every day | ORAL | 3 refills | Status: DC

## 2020-07-23 MED ORDER — LEVOTHYROXINE SODIUM 25 MCG PO TABS: ORAL_TABLET | ORAL | 3 refills | Status: DC

## 2020-07-23 MED ORDER — LEVOTHYROXINE SODIUM 200 MCG PO TABS: 200.0000 ug | ORAL_TABLET | Freq: Every day | ORAL | 3 refills | Status: DC

## 2020-07-23 MED ORDER — METFORMIN HCL 1000 MG PO TABS: ORAL_TABLET | ORAL | 3 refills | Status: DC

## 2020-07-23 MED ORDER — GLIPIZIDE ER 5 MG PO TB24: 5.0000 mg | ORAL_TABLET | Freq: Every day | ORAL | 3 refills | Status: DC

## 2020-07-23 MED ORDER — FLUDROCORTISONE ACETATE 0.1 MG PO TABS: 0.1000 mg | ORAL_TABLET | Freq: Every day | ORAL | 1 refills | Status: DC

## 2020-07-23 NOTE — Patient Instructions (Signed)

## 2020-07-23 NOTE — Progress Notes (Signed)
07/23/2020, 4:16 PM   Endocrinology follow-up note   Subjective:    Patient ID: Jeffery Daniels, male    DOB: 1965/09/16.  Jeffery Daniels is being seen in follow-up for management of currently uncontrolled type 2 diabetes, adrenal insufficiency, hypothyroidism, hyperlipidemia, hypertension.    PMD:   Assunta Found, MD.   Past Medical History:  Diagnosis Date  . Addison disease (HCC)   . Hyperlipidemia    Past Surgical History:  Procedure Laterality Date  . HERNIA REPAIR     Social History   Socioeconomic History  . Marital status: Single    Spouse name: Not on file  . Number of children: Not on file  . Years of education: Not on file  . Highest education level: Not on file  Occupational History  . Not on file  Tobacco Use  . Smoking status: Current Every Day Smoker    Packs/day: 1.50    Years: 25.00    Pack years: 37.50    Types: Cigarettes  . Smokeless tobacco: Never Used  Vaping Use  . Vaping Use: Never used  Substance and Sexual Activity  . Alcohol use: Yes    Comment: Occasional  . Drug use: Never  . Sexual activity: Never  Other Topics Concern  . Not on file  Social History Narrative  . Not on file   Social Determinants of Health   Financial Resource Strain: Not on file  Food Insecurity: Not on file  Transportation Needs: Not on file  Physical Activity: Not on file  Stress: Not on file  Social Connections: Not on file   Outpatient Encounter Medications as of 07/23/2020  Medication Sig  . lisinopril (ZESTRIL) 5 MG tablet Take 1 tablet (5 mg total) by mouth daily.  . Cholecalciferol (VITAMIN D3) 125 MCG (5000 UT) CAPS Take 1 capsule (5,000 Units total) by mouth daily.  . fludrocortisone (FLORINEF) 0.1 MG tablet Take 1 tablet (0.1 mg total) by mouth daily.  Marland Kitchen glipiZIDE (GLUCOTROL XL) 5 MG 24 hr tablet Take 1 tablet (5 mg total) by mouth daily with breakfast.  . glucose blood test strip Use as instructed  .  hydrochlorothiazide (HYDRODIURIL) 25 MG tablet TAKE ONE (1) TABLET BY MOUTH EVERY DAY  . hydrocortisone (CORTEF) 10 MG tablet Take 1-1.5 tablets (10-15 mg total) by mouth daily. Take 15 mg po in the morning and 10 mg po daily at noon  . LANCETS ULTRA FINE MISC 1 each by Does not apply route 4 (four) times daily.  Marland Kitchen levothyroxine (SYNTHROID) 200 MCG tablet Take 1 tablet (200 mcg total) by mouth daily before breakfast.  . levothyroxine (SYNTHROID) 25 MCG tablet TAKE ONE TABLET ( TOTAL) BY MOUTH DAILY BEFORE BREAKFAST  . metFORMIN (GLUCOPHAGE) 1000 MG tablet TAKE ONE TABLET ( ) BY MOUTH TWICE DAILY WITH A MEAL  . simvastatin (ZOCOR) 20 MG tablet TAKE ONE (1) TABLET BY MOUTH EVERY DAY  . [DISCONTINUED] fludrocortisone (FLORINEF) 0.1 MG tablet TAKE ONE (1) TABLET BY MOUTH EVERY DAY  . [DISCONTINUED] glipiZIDE (GLUCOTROL XL) 5 MG 24 hr tablet Take 1 tablet (5 mg total) by mouth daily with breakfast.  . [DISCONTINUED] hydrocortisone (CORTEF) 10 MG tablet TAKE ONE AND ONE-HALF TABLET (  TOTAL) EVERY MORNING AND ONE TABLET (  TOTAL) EVERYDAY AT NOON  . [DISCONTINUED] levothyroxine (SYNTHROID) 200 MCG tablet Take 1 tablet (200 mcg total) by mouth daily before breakfast.  . [DISCONTINUED] levothyroxine (SYNTHROID) 25 MCG tablet TAKE ONE TABLET ( TOTAL)  BY MOUTH DAILY BEFORE BREAKFAST  . [DISCONTINUED] metFORMIN (GLUCOPHAGE) 1000 MG tablet TAKE ONE TABLET (1000MG ) BY MOUTH TWICE DAILY WITH A MEAL   No facility-administered encounter medications on file as of 07/23/2020.    ALLERGIES: Allergies  Allergen Reactions  . Bee Venom     VACCINATION STATUS:  There is no immunization history on file for this patient.  Diabetes He presents for his follow-up diabetic visit. He has type 2 diabetes mellitus. Onset time: He diagnosed at age of 55 years. His disease course has been stable. There are no hypoglycemic associated symptoms. Pertinent negatives for hypoglycemia include no headaches,  seizures or tremors. Pertinent negatives for diabetes include no blurred vision, no chest pain, no foot ulcerations, no polydipsia, no polyuria and no weight loss. There are no hypoglycemic complications. Symptoms are stable. There are no diabetic complications. Risk factors for coronary artery disease include diabetes mellitus, dyslipidemia, male sex, obesity, hypertension, sedentary lifestyle, tobacco exposure and family history. Current diabetic treatment includes oral agent (dual therapy). He is compliant with treatment most of the time. His weight is decreasing steadily. He is following a generally healthy diet. When asked about meal planning, he reported none. He has not had a previous visit with a dietitian. He rarely participates in exercise. His home blood glucose trend is fluctuating minimally. (He presents today with no meter or logs to review.  He does monitor glucose once daily before breakfast.  His POCT A1c today is 6.2%, improving from last visit of 6.4%.  He has lost 11 lbs since last visit by working on his diet and exercise.  He denies any s/s of hypoglycemia.) An ACE inhibitor/angiotensin II receptor blocker is not being taken. He does not see a podiatrist.Eye exam is current.  Hyperlipidemia This is a chronic problem. The current episode started more than 1 year ago. The problem is uncontrolled. Recent lipid tests were reviewed and are variable. Exacerbating diseases include diabetes, hypothyroidism and obesity. Factors aggravating his hyperlipidemia include smoking and thiazides. Pertinent negatives include no chest pain, myalgias or shortness of breath. Current antihyperlipidemic treatment includes statins. The current treatment provides mild improvement of lipids. Compliance problems include adherence to diet and adherence to exercise.  Risk factors for coronary artery disease include dyslipidemia, diabetes mellitus, family history, obesity, male sex, hypertension and a sedentary lifestyle.   Hypertension This is a chronic problem. The current episode started more than 1 year ago. The problem has been gradually improving since onset. The problem is controlled. Pertinent negatives include no blurred vision, chest pain, headaches, palpitations or shortness of breath. Agents associated with hypertension include thyroid hormones and steroids. Risk factors for coronary artery disease include diabetes mellitus, dyslipidemia, family history, obesity, male gender, sedentary lifestyle and smoking/tobacco exposure. Past treatments include diuretics. The current treatment provides mild improvement. There are no compliance problems.  Identifiable causes of hypertension include a hypertension causing med and a thyroid problem.     Objective:    BP (!) 154/108   Pulse 77   Ht 5\' 11"  (1.803 m)   Wt 263 lb (119.3 kg)   BMI 36.68 kg/m   Wt Readings from Last 3 Encounters:  07/23/20 263 lb (119.3 kg)  03/17/20 274 lb (124.3 kg)  12/12/19 268 lb 3.2 oz (121.7 kg)     BP Readings from Last 3 Encounters:  07/23/20 (!) 154/108  03/17/20 (!) 152/90  12/12/19 120/82    Physical Exam- Limited  Constitutional:  Body mass index is 36.68 kg/m. , not  in acute distress, normal state of mind Eyes:  EOMI, no exophthalmos Neck: Supple Cardiovascular: RRR, no murmers, rubs, or gallops, no edema Respiratory: Adequate breathing efforts, no crackles, rales, rhonchi, or wheezing Musculoskeletal: no gross deformities, strength intact in all four extremities, no gross restriction of joint movements Skin:  no rashes, no hyperemia Neurological: no tremor with outstretched hands   Foot exam:   No rashes, ulcers, cuts, calluses, onychodystrophy.   + ingrown toenail to bilateral great toes  Good pulses bilat.  Good sensation to 10 g monofilament bilat.   POCT ABI Results 07/23/20   Right ABI:  1.27      Left ABI:  1.29  Right leg systolic / diastolic: 196/109 mmHg Left leg systolic /  diastolic: 198/115 mmHg  Arm systolic / diastolic: 154/108 mmHG  Detailed report will be scanned into patient chart.    Recent Results (from the past 2160 hour(s))  HgB A1c     Status: Abnormal   Collection Time: 07/23/20  4:04 PM  Result Value Ref Range   Hemoglobin A1C 6.2 (A) 4.0 - 5.6 %   HbA1c POC (<> result, manual entry)     HbA1c, POC (prediabetic range)     HbA1c, POC (controlled diabetic range)     Lipid Panel     Component Value Date/Time   CHOL 176 09/29/2017 0910   TRIG 205 (H) 09/29/2017 0910   HDL 52 09/29/2017 0910   LDLCALC 83 09/29/2017 0910     Assessment & Plan:   1) Uncontrolled type 2 diabetes mellitus with hyperglycemia   - Jeffery Daniels has currently controlled asymptomatic type 2 DM since 55 years of age.  He presents today with no meter or logs to review.  He does monitor glucose once daily before breakfast.  His POCT A1c today is 6.2%, improving from last visit of 6.4%.  He has lost 11 lbs since last visit by working on his diet and exercise.  He denies any s/s of hypoglycemia.  -his diabetes is complicated by adrenal insufficiency on hydrocortisone, hypothyroidism, obesity/sedentary life, chronic heavy smoking and Jeffery Daniels remains at a high risk for more acute and chronic complications which include CAD, CVA, CKD, retinopathy, and neuropathy. These are all discussed in detail with the patient.  - Nutritional counseling repeated at each appointment due to patients tendency to fall back in to old habits.  - The patient admits there is a room for improvement in their diet and drink choices. -  Suggestion is made for the patient to avoid simple carbohydrates from their diet including Cakes, Sweet Desserts / Pastries, Ice Cream, Soda (diet and regular), Sweet Tea, Candies, Chips, Cookies, Sweet Pastries, Store Bought Juices, Alcohol in Excess of 1-2 drinks a day, Artificial Sweeteners, Coffee Creamer, and "Sugar-free" Products. This will help  patient to have stable blood glucose profile and potentially avoid unintended weight gain.   - I encouraged the patient to switch to unprocessed or minimally processed complex starch and increased protein intake (animal or plant source), fruits, and vegetables.   - Patient is advised to stick to a routine mealtimes to eat 3 meals a day and avoid unnecessary snacks (to snack only to correct hypoglycemia).  - I have approached him with the following individualized plan to manage diabetes and patient agrees:   Based on his stable glycemic profile, no changes will be made to his medication regimen today.    -He is advised to continue Metformin 1000 mg p.o. twice daily  with meals and continue Glipizide 5 mg XL daily with breakfast.   -He is encouraged to start monitoring blood glucose consistently, at least once daily, before breakfast, and call the clinic if he has readings less than 70 or greater than 200 for 3 tests in a row.  - he continues to smoke heavily, not a suitable candidate for incretin therapy.  If he continues to lose control of diabetes, he will be considered for basal insulin.   - Patient specific target  A1c;  LDL, HDL, Triglycerides, were discussed in detail.  2) BP/HTN:  His blood pressure is not controlled to target.  He just smoked a cigarette prior to coming in the office. He is advised to continue HCTZ 25 mg po daily.  I discussed and initiated Lisinopril 5 mg po daily at breakfast.  3) Lipids/HPL:  His most recent lipid panel from 09/29/17 shows controlled LDL of 83 and elevated triglycerides of 205.  He is advised to continue Simvastatin 20 mg po daily at bedtime.  Side effects and precautions discussed with him.  Will recheck lipid panel prior to next visit.  4)  Weight/Diet:  His Body mass index is 36.68 kg/m.-clearly complicating his diabetes care.  He has lost 11 lbs since last visit.  He is a candidate for modest weight loss.  CDE Consult has been  initiated ,  exercise, and detailed carbohydrates information provided.  5) Adrenal insufficiency (HCC) - The circumstances of his diagnosis 25 years ago are not available available to review. He reports Addison's Disease. He will be considered for work up with anti 21 hydroxylase antibody on subsequent visits. -He is on a stable dose of steroids.  He is advised to continue his current steroid replacement therapy including Hydrocortisone 15 mg p.o. daily in the morning, and 10 mg daily at noon. He is advised to continue Fludrocortisone 0.1mg  po daily.   -He is advised not to discontinue his steroid replacement without first discussing it with his providers.  6) Hypothyroidism -There are no recent TFTs to review.  He is advised to continue current dose of Levothyroxine 225 mcg po daily before breakfast.  Will recheck thyroid function tests prior to his next visit and adjust dose if necessary.     - We discussed about the correct intake of his thyroid hormone, on empty stomach at fasting, with water, separated by at least 30 minutes from breakfast and other medications,  and separated by more than 4 hours from calcium, iron, multivitamins, acid reflux medications (PPIs). -Patient is made aware of the fact that thyroid hormone replacement is needed for life, dose to be adjusted by periodic monitoring of thyroid function tests.  7) Chronic Care/Health Maintenance: -he is not on ACEI/ARB, is on Statin medications and is encouraged to continue to follow up with Ophthalmology, Dentist, Podiatrist at least yearly or according to recommendations, and advised to Quit smoking. I have recommended yearly flu vaccine and pneumonia vaccination at least every 5 years; moderate intensity exercise for up to 150 minutes weekly; and  sleep for at least 7 hours a day.  The patient was counseled on the dangers of tobacco use, and was advised to quit.  Reviewed strategies to maximize success, including removing cigarettes and smoking  materials from environment.  - I advised patient to maintain close follow up with Assunta Found, MD for primary care needs.   - Time spent on this patient care encounter:  40 min, of which > 50% was spent in counseling and  the rest reviewing his blood glucose logs, discussing his hypoglycemia and hyperglycemia episodes, reviewing his current and previous labs/studies (including abstraction from other facilities) and medications doses and developing a long term treatment plan and documenting his care.   Please refer to Patient Instructions for Blood Glucose Monitoring and Insulin/Medications Dosing Guide" in media tab for additional information. Please also refer to "Patient Self Inventory" in the Media tab for reviewed elements of pertinent patient history.  Jeffery Daniels participated in the discussions, expressed understanding, and voiced agreement with the above plans.  All questions were answered to his satisfaction. he is encouraged to contact clinic should he have any questions or concerns prior to his return visit.   Follow up plan: - Return in about 4 months (around 11/22/2020) for Diabetes follow up- A1c and urine micro in office, Previsit labs, Bring glucometer and logs.  Jeffery Daniels, [facility] [address] [place], [place] [address] Phone: [phone] Fax: [phone]  07/23/2020, 4:16 PM

## 2020-08-24 ENCOUNTER — Other Ambulatory Visit: Payer: Self-pay | Admitting: Nurse Practitioner

## 2020-11-24 ENCOUNTER — Ambulatory Visit: Payer: BC Managed Care – PPO | Admitting: Nurse Practitioner

## 2020-12-01 ENCOUNTER — Other Ambulatory Visit: Payer: Self-pay | Admitting: Nurse Practitioner

## 2020-12-05 LAB — COMPREHENSIVE METABOLIC PANEL
ALT: 22 IU/L (ref 0–44)
AST: 21 IU/L (ref 0–40)
Albumin/Globulin Ratio: 1.8 (ref 1.2–2.2)
Albumin: 4.4 g/dL (ref 3.8–4.9)
Alkaline Phosphatase: 75 IU/L (ref 44–121)
BUN/Creatinine Ratio: 14 (ref 9–20)
BUN: 11 mg/dL (ref 6–24)
Bilirubin Total: 0.4 mg/dL (ref 0.0–1.2)
CO2: 20 mmol/L (ref 20–29)
Calcium: 9.2 mg/dL (ref 8.7–10.2)
Chloride: 103 mmol/L (ref 96–106)
Creatinine, Ser: 0.81 mg/dL (ref 0.76–1.27)
Globulin, Total: 2.4 g/dL (ref 1.5–4.5)
Glucose: 102 mg/dL — ABNORMAL HIGH (ref 65–99)
Potassium: 4.5 mmol/L (ref 3.5–5.2)
Sodium: 143 mmol/L (ref 134–144)
Total Protein: 6.8 g/dL (ref 6.0–8.5)
eGFR: 104 mL/min/{1.73_m2} (ref >59)

## 2020-12-05 LAB — LIPID PANEL
Chol/HDL Ratio: 4.2 ratio (ref 0.0–5.0)
Cholesterol, Total: 172 mg/dL (ref 100–199)
HDL: 41 mg/dL (ref >39)
LDL Chol Calc (NIH): 56 mg/dL (ref 0–99)
Triglycerides: 500 mg/dL — ABNORMAL HIGH (ref 0–149)
VLDL Cholesterol Cal: 75 mg/dL — ABNORMAL HIGH (ref 5–40)

## 2020-12-05 LAB — T4, FREE: Free T4: 1.3 ng/dL (ref 0.82–1.77)

## 2020-12-05 LAB — VITAMIN D 25 HYDROXY (VIT D DEFICIENCY, FRACTURES): Vit D, 25-Hydroxy: 25.3 ng/mL — ABNORMAL LOW (ref 30.0–100.0)

## 2020-12-05 LAB — TSH: TSH: 1.72 u[IU]/mL (ref 0.450–4.500)

## 2020-12-09 NOTE — Patient Instructions (Signed)
Advice for Weight Management  -For most of us the best way to lose weight is by diet management. Generally speaking, diet management means consuming less calories intentionally which over time brings about progressive weight loss.  This can be achieved more effectively by restricting carbohydrate consumption to the minimum possible.  So, it is critically important to know your numbers: how much calorie you are consuming and how much calorie you need. More importantly, our carbohydrates sources should be unprocessed or minimally processed complex starch food items.   Sometimes, it is important to balance nutrition by increasing protein intake (animal or plant source), fruits, and vegetables.  -Sticking to a routine mealtime to eat 3 meals a day and avoiding unnecessary snacks is shown to have a big role in weight control. Under normal circumstances, the only time we lose real weight is when we are hungry, so allow hunger to take place- hunger means no food between meal times, only water.  It is not advisable to starve.   -It is better to avoid simple carbohydrates including: Cakes, Sweet Desserts, Ice Cream, Soda (diet and regular), Sweet Tea, Candies, Chips, Cookies, Store Bought Juices, Alcohol in Excess of  1-2 drinks a day, Artificial Sweeteners, Doughnuts, Coffee Creamers, "Sugar-free" Products, etc, etc.  This is not a complete list.....    -Consulting with certified diabetes educators is proven to provide you with the most accurate and current information on diet.  Also, you may be  interested in discussing diet options/exchanges , we can schedule a visit with Jeffery Daniels, RDN, CDE for individualized nutrition education.  -Exercise: If you are able: 30 -60 minutes a day ,4 days a week, or 150 minutes a week.  The longer the better.  Combine stretch, strength, and aerobic activities.  If you were told in the past that you have high risk for cardiovascular diseases, you may seek evaluation by  your heart doctor prior to initiating moderate to intense exercise programs.    

## 2020-12-10 ENCOUNTER — Other Ambulatory Visit: Payer: Self-pay

## 2020-12-10 ENCOUNTER — Ambulatory Visit (INDEPENDENT_AMBULATORY_CARE_PROVIDER_SITE_OTHER): Payer: Commercial Managed Care - PPO | Admitting: Nurse Practitioner

## 2020-12-10 ENCOUNTER — Encounter: Payer: Self-pay | Admitting: Nurse Practitioner

## 2020-12-10 VITALS — BP 140/82 | HR 75 | Ht 71.0 in | Wt 273.0 lb

## 2020-12-10 DIAGNOSIS — I10 Essential (primary) hypertension: Secondary | ICD-10-CM

## 2020-12-10 DIAGNOSIS — E1165 Type 2 diabetes mellitus with hyperglycemia: Secondary | ICD-10-CM | POA: Diagnosis not present

## 2020-12-10 DIAGNOSIS — F17210 Nicotine dependence, cigarettes, uncomplicated: Secondary | ICD-10-CM

## 2020-12-10 DIAGNOSIS — E782 Mixed hyperlipidemia: Secondary | ICD-10-CM

## 2020-12-10 DIAGNOSIS — E039 Hypothyroidism, unspecified: Secondary | ICD-10-CM | POA: Diagnosis not present

## 2020-12-10 DIAGNOSIS — E559 Vitamin D deficiency, unspecified: Secondary | ICD-10-CM

## 2020-12-10 DIAGNOSIS — E2740 Unspecified adrenocortical insufficiency: Secondary | ICD-10-CM | POA: Diagnosis not present

## 2020-12-10 DIAGNOSIS — Z6838 Body mass index (BMI) 38.0-38.9, adult: Secondary | ICD-10-CM

## 2020-12-10 DIAGNOSIS — E669 Obesity, unspecified: Secondary | ICD-10-CM

## 2020-12-10 MED ORDER — FENOFIBRATE 145 MG PO TABS: 145.0000 mg | ORAL_TABLET | Freq: Every day | ORAL | 3 refills | Status: DC

## 2020-12-10 NOTE — Progress Notes (Signed)
12/10/2020, 3:51 PM   Endocrinology follow-up note   Subjective:    Patient ID: Jeffery Daniels, male    DOB: May 20, 1965.  Jeffery Daniels is being seen in follow-up for management of currently uncontrolled type 2 diabetes, adrenal insufficiency, hypothyroidism, hyperlipidemia, hypertension.    PMD:   Sharilyn Sites, MD.   Past Medical History:  Diagnosis Date   Addison disease [facility])    Hyperlipidemia    Past Surgical History:  Procedure Laterality Date   HERNIA REPAIR     Social History   Socioeconomic History   Marital status: Single    Spouse name: Not on file   Number of children: Not on file   Years of education: Not on file   Highest education level: Not on file  Occupational History   Not on file  Tobacco Use   Smoking status: Every Day    Packs/day: 1.50    Years: 25.00    Pack years: 37.50    Types: Cigarettes   Smokeless tobacco: Never  Vaping Use   Vaping Use: Never used  Substance and Sexual Activity   Alcohol use: Yes    Comment: Occasional   Drug use: Never   Sexual activity: Never  Other Topics Concern   Not on file  Social History Narrative   Not on file   Social Determinants of Health   Financial Resource Strain: Not on file  Food Insecurity: Not on file  Transportation Needs: Not on file  Physical Activity: Not on file  Stress: Not on file  Social Connections: Not on file   Outpatient Encounter Medications as of 12/10/2020  Medication Sig   fenofibrate (TRICOR) 145 MG tablet Take 1 tablet (145 mg total) by mouth daily.   Cholecalciferol (VITAMIN D3) 125 MCG (5000 UT) CAPS Take 1 capsule (5,000 Units total) by mouth daily.   fludrocortisone (FLORINEF) 0.1 MG tablet Take 1 tablet (0.1 mg total) by mouth daily.   glipiZIDE (GLUCOTROL XL) 5 MG 24 hr tablet Take 1 tablet (5 mg total) by mouth daily with breakfast.   glucose blood test strip Use as instructed   hydrochlorothiazide (HYDRODIURIL) 25 MG tablet TAKE  ONE (1) TABLET BY MOUTH EVERY DAY   hydrocortisone (CORTEF) 10 MG tablet Take 1-1.5 tablets (10-15 mg total) by mouth daily. Take 15 mg po in the morning and 10 mg po daily at noon   LANCETS ULTRA FINE MISC 1 each by Does not apply route 4 (four) times daily.   levothyroxine (SYNTHROID) 200 MCG tablet Take 1 tablet (200 mcg total) by mouth daily before breakfast.   levothyroxine (SYNTHROID) 25 MCG tablet TAKE ONE TABLET (25MCG TOTAL) BY MOUTH DAILY BEFORE BREAKFAST   lisinopril (ZESTRIL) 5 MG tablet Take 1 tablet (5 mg total) by mouth daily.   metFORMIN (GLUCOPHAGE) 1000 MG tablet TAKE ONE TABLET ($RemoveBef'1000MG'fRGuHCyokB$ ) BY MOUTH TWICE DAILY WITH A MEAL   simvastatin (ZOCOR) 20 MG tablet TAKE ONE (1) TABLET BY MOUTH EVERY DAY   No facility-administered encounter medications on file as of 12/10/2020.    ALLERGIES: Allergies  Allergen Reactions   Bee Venom     VACCINATION STATUS:  There is no immunization history on file for this patient.  Diabetes He presents for his follow-up diabetic visit. He has type 2 diabetes mellitus. Onset time: He diagnosed at age of 55 years. His disease course has been stable. There are no hypoglycemic associated symptoms. Pertinent negatives for hypoglycemia include no  headaches, seizures or tremors. Pertinent negatives for diabetes include no blurred vision, no chest pain, no foot ulcerations, no polydipsia, no polyuria and no weight loss. There are no hypoglycemic complications. Symptoms are stable. There are no diabetic complications. Risk factors for coronary artery disease include diabetes mellitus, dyslipidemia, male sex, obesity, hypertension, sedentary lifestyle, tobacco exposure and family history. Current diabetic treatment includes oral agent (dual therapy). He is compliant with treatment most of the time. His weight is fluctuating minimally. He is following a generally healthy diet. When asked about meal planning, he reported none. He has not had a previous visit with a  dietitian. He rarely participates in exercise. His home blood glucose trend is fluctuating minimally. (He presents today with no meter or logs to review.  His POCT A1c today is 6.1%, essentially unchanged from previous visit of 6.2%.  He denies any hypoglycemia.  He has gained back some of the weight he previously lost, says he hasn't been eating as healthy lately.) An ACE inhibitor/angiotensin II receptor blocker is not being taken. He does not see a podiatrist.Eye exam is current.  Hyperlipidemia This is a chronic problem. The current episode started more than 1 year ago. The problem is uncontrolled. Recent lipid tests were reviewed and are variable. Exacerbating diseases include diabetes, hypothyroidism and obesity. Factors aggravating his hyperlipidemia include smoking and thiazides. Pertinent negatives include no chest pain, myalgias or shortness of breath. Current antihyperlipidemic treatment includes statins. The current treatment provides mild improvement of lipids. Compliance problems include adherence to diet and adherence to exercise.  Risk factors for coronary artery disease include dyslipidemia, diabetes mellitus, family history, obesity, male sex, hypertension and a sedentary lifestyle.  Hypertension This is a chronic problem. The current episode started more than 1 year ago. The problem has been gradually improving since onset. The problem is controlled. Pertinent negatives include no blurred vision, chest pain, headaches, palpitations or shortness of breath. Agents associated with hypertension include thyroid hormones and steroids. Risk factors for coronary artery disease include diabetes mellitus, dyslipidemia, family history, obesity, male gender, sedentary lifestyle and smoking/tobacco exposure. Past treatments include diuretics. The current treatment provides mild improvement. There are no compliance problems.  Identifiable causes of hypertension include a hypertension causing med and a  thyroid problem.   Review of systems  Constitutional: + Minimally fluctuating body weight,  current Body mass index is 38.08 kg/m. , no fatigue, no subjective hyperthermia, no subjective hypothermia Eyes: no blurry vision, no xerophthalmia ENT: no sore throat, no nodules palpated in throat, no dysphagia/odynophagia, no hoarseness Cardiovascular: no chest pain, no shortness of breath, no palpitations, no leg swelling Respiratory: no cough, no shortness of breath Gastrointestinal: no nausea/vomiting/diarrhea Musculoskeletal: no muscle/joint aches Skin: no rashes, no hyperemia Neurological: no tremors, no numbness, no tingling, no dizziness Psychiatric: no depression, no anxiety   Objective:    BP 140/82   Pulse 75   Ht $R'5\' 11"'bW$  (1.803 m)   Wt 273 lb (123.8 kg)   BMI 38.08 kg/m   Wt Readings from Last 3 Encounters:  12/10/20 273 lb (123.8 kg)  07/23/20 263 lb (119.3 kg)  03/17/20 274 lb (124.3 kg)     BP Readings from Last 3 Encounters:  12/10/20 140/82  07/23/20 (!) 154/108  03/17/20 (!) 152/90     Physical Exam- Limited  Constitutional:  Body mass index is 38.08 kg/m. , not in acute distress, normal state of mind Eyes:  EOMI, no exophthalmos Neck: Supple Cardiovascular: RRR, no murmurs, rubs, or gallops, no  edema Respiratory: Adequate breathing efforts, no crackles, rales, rhonchi, or wheezing Musculoskeletal: no gross deformities, strength intact in all four extremities, no gross restriction of joint movements Skin:  no rashes, no hyperemia Neurological: no tremor with outstretched hands     Recent Results (from the past 2160 hour(s))  Comprehensive metabolic panel     Status: Abnormal   Collection Time: 12/04/20  8:36 AM  Result Value Ref Range   Glucose 102 (H) 65 - 99 mg/dL   BUN 11 6 - 24 mg/dL   Creatinine, Ser 0.81 0.76 - 1.27 mg/dL   eGFR 104 >59 mL/min/1.73   BUN/Creatinine Ratio 14 9 - 20   Sodium 143 134 - 144 mmol/L   Potassium 4.5 3.5 - 5.2  mmol/L   Chloride 103 96 - 106 mmol/L   CO2 20 20 - 29 mmol/L   Calcium 9.2 8.7 - 10.2 mg/dL   Total Protein 6.8 6.0 - 8.5 g/dL   Albumin 4.4 3.8 - 4.9 g/dL   Globulin, Total 2.4 1.5 - 4.5 g/dL   Albumin/Globulin Ratio 1.8 1.2 - 2.2   Bilirubin Total 0.4 0.0 - 1.2 mg/dL   Alkaline Phosphatase 75 44 - 121 IU/L   AST 21 0 - 40 IU/L   ALT 22 0 - 44 IU/L  Lipid panel     Status: Abnormal   Collection Time: 12/04/20  8:36 AM  Result Value Ref Range   Cholesterol, Total 172 100 - 199 mg/dL   Triglycerides 500 (H) 0 - 149 mg/dL   HDL 41 >39 mg/dL   VLDL Cholesterol Cal 75 (H) 5 - 40 mg/dL   LDL Chol Calc (NIH) 56 0 - 99 mg/dL   Chol/HDL Ratio 4.2 0.0 - 5.0 ratio    Comment:                                   T. Chol/HDL Ratio                                             Men  Women                               1/2 Avg.Risk  3.4    3.3                                   Avg.Risk  5.0    4.4                                2X Avg.Risk  9.6    7.1                                3X Avg.Risk 23.4   11.0   TSH     Status: None   Collection Time: 12/04/20  8:36 AM  Result Value Ref Range   TSH 1.720 0.450 - 4.500 uIU/mL  T4, free     Status: None   Collection Time: 12/04/20  8:36 AM  Result Value Ref Range   Free T4 1.30 0.82 -  1.77 ng/dL  VITAMIN D 25 Hydroxy (Vit-D Deficiency, Fractures)     Status: Abnormal   Collection Time: 12/04/20  8:36 AM  Result Value Ref Range   Vit D, 25-Hydroxy 25.3 (L) 30.0 - 100.0 ng/mL    Comment: Vitamin D deficiency has been defined by the [place] practice guideline as a level of serum 25-OH vitamin D less than 20 ng/mL (1,2). The Endocrine Society went on to further define vitamin D insufficiency as a level between 21 and 29 ng/mL (2). 1. IOM (Institute of Medicine). 2010. Dietary reference    intakes for calcium and D. [place]: The    [employer]. 2. Holick MF, Binkley [place], Bischoff-Ferrari HA,  et al.    Evaluation, treatment, and prevention of vitamin D    deficiency: an Endocrine Society clinical practice    guideline. JCEM. 2011 Jul; 96(7):1911-30.    Lipid Panel     Component Value Date/Time   CHOL 172 12/04/2020 0836   TRIG 500 (H) 12/04/2020 0836   HDL 41 12/04/2020 0836   CHOLHDL 4.2 12/04/2020 0836   LDLCALC 56 12/04/2020 0836     Assessment & Plan:   1) Uncontrolled type 2 diabetes mellitus with hyperglycemia   - CADELL GABRIELSON has currently controlled asymptomatic type 2 DM since 55 years of age.  He presents today with no meter or logs to review.  His POCT A1c today is 6.1%, essentially unchanged from previous visit of 6.2%.  He denies any hypoglycemia.  He has gained back some of the weight he previously lost, says he hasn't been eating as healthy lately.  -his diabetes is complicated by adrenal insufficiency on hydrocortisone, hypothyroidism, obesity/sedentary life, chronic heavy smoking and ROARKE MARCIANO remains at a high risk for more acute and chronic complications which include CAD, CVA, CKD, retinopathy, and neuropathy. These are all discussed in detail with the patient.  - Nutritional counseling repeated at each appointment due to patients tendency to fall back in to old habits.  - The patient admits there is a room for improvement in their diet and drink choices. -  Suggestion is made for the patient to avoid simple carbohydrates from their diet including Cakes, Sweet Desserts / Pastries, Ice Cream, Soda (diet and regular), Sweet Tea, Candies, Chips, Cookies, Sweet Pastries, Store Bought Juices, Alcohol in Excess of 1-2 drinks a day, Artificial Sweeteners, Coffee Creamer, and "Sugar-free" Products. This will help patient to have stable blood glucose profile and potentially avoid unintended weight gain.   - I encouraged the patient to switch to unprocessed or minimally processed complex starch and increased protein intake (animal or plant source),  fruits, and vegetables.   - Patient is advised to stick to a routine mealtimes to eat 3 meals a day and avoid unnecessary snacks (to snack only to correct hypoglycemia).  - I have approached him with the following individualized plan to manage diabetes and patient agrees:   Based on his stable glycemic profile, no changes will be made to his medication regimen today.    -He is advised to continue Metformin 1000 mg p.o. twice daily with meals and continue Glipizide 5 mg XL daily with breakfast.   -He is encouraged to start monitoring blood glucose consistently, at least once daily, before breakfast, and call the clinic if he has readings less than 70 or greater than 200 for 3 tests in a row.  - he continues to smoke heavily, not a suitable  candidate for incretin therapy.  If he continues to lose control of diabetes, he will be considered for basal insulin.   - Patient specific target  A1c;  LDL, HDL, Triglycerides, were discussed in detail.  2) BP/HTN:  His blood pressure is controlled to target.  He is advised to continue HCTZ 25 mg po daily and Lisinopril 5 mg po daily.   3) Lipids/HPL:  His most recent lipid panel from 12/04/20 shows controlled LDL of 56 and significantly elevated triglycerides of 500.   He is advised to continue Simvastatin 20 mg po daily at bedtime.  I discussed and initiated Fenofibrate 145 mg po daily at bedtime.  Side effects and precautions discussed with him.    4)  Weight/Diet:  His Body mass index is 38.08 kg/m.-clearly complicating his diabetes care.  He has lost 11 lbs since last visit.  He is a candidate for modest weight loss.  CDE Consult has been  initiated , exercise, and detailed carbohydrates information provided.  5) Adrenal insufficiency (HCC) - The circumstances of his diagnosis 25 years ago are not available available to review. He reports Addison's Disease. He will be considered for work up with anti 21 hydroxylase antibody on subsequent  visits. -He is on a stable dose of steroids.  He is advised to continue his current steroid replacement therapy including Hydrocortisone 15 mg p.o. daily in the morning, and 10 mg daily at noon. He is advised to continue Fludrocortisone 0.1mg  po daily.   -He is advised not to discontinue his steroid replacement without first discussing it with his providers.  6) Hypothyroidism -His most recent thyroid function tests are consistent with appropriate hormone replacement.  He is advised to continue Levothyroxine 225 mcg po daily before breakfast.       - We discussed about the correct intake of his thyroid hormone, on empty stomach at fasting, with water, separated by at least 30 minutes from breakfast and other medications,  and separated by more than 4 hours from calcium, iron, multivitamins, acid reflux medications (PPIs). -Patient is made aware of the fact that thyroid hormone replacement is needed for life, dose to be adjusted by periodic monitoring of thyroid function tests.  7) Vitamin D deficiency His most recent vitamin D level was 25.3 on 12/04/20.  He has taken OTC Vitamin D3 5000 units daily in the past but is not currently taking any.  He is advised to restart.  8) Chronic Care/Health Maintenance: -he is not on ACEI/ARB, is on Statin medications and is encouraged to continue to follow up with Ophthalmology, Dentist, Podiatrist at least yearly or according to recommendations, and advised to Quit smoking. I have recommended yearly flu vaccine and pneumonia vaccination at least every 5 years; moderate intensity exercise for up to 150 minutes weekly; and  sleep for at least 7 hours a day.  The patient was counseled on the dangers of tobacco use, and was advised to quit.  Reviewed strategies to maximize success, including removing cigarettes and smoking materials from environment.  - I advised patient to maintain close follow up with Sharilyn Sites, MD for primary care needs.     I spent 30  minutes in the care of the patient today including review of labs from [place], Lipids, Thyroid Function, Hematology (current and previous including abstractions from other facilities); face-to-face time discussing  his blood glucose readings/logs, discussing hypoglycemia and hyperglycemia episodes and symptoms, medications doses, his options of short and long term treatment based on the latest standards  of care / guidelines;  discussion about incorporating lifestyle medicine;  and documenting the encounter.    Please refer to Patient Instructions for Blood Glucose Monitoring and Insulin/Medications Dosing Guide"  in media tab for additional information. Please  also refer to " Patient Self Inventory" in the Media  tab for reviewed elements of pertinent patient history.  Kathrin Penner participated in the discussions, expressed understanding, and voiced agreement with the above plans.  All questions were answered to his satisfaction. he is encouraged to contact clinic should he have any questions or concerns prior to his return visit.   Follow up plan: - Return in about 4 months (around 04/11/2021) for Diabetes F/U with A1c in office, Thyroid follow up, No previsit labs, Bring meter and logs.   Rayetta Pigg, [facility] [address] [place], [place] [address] Phone: [phone] Fax: [phone]  12/10/2020, 3:51 PM

## 2021-03-04 ENCOUNTER — Other Ambulatory Visit: Payer: Self-pay | Admitting: Nurse Practitioner

## 2021-04-14 ENCOUNTER — Ambulatory Visit: Payer: Commercial Managed Care - PPO | Admitting: Nurse Practitioner

## 2021-04-15 ENCOUNTER — Encounter: Payer: Self-pay | Admitting: Nurse Practitioner

## 2021-04-15 ENCOUNTER — Ambulatory Visit: Payer: Commercial Managed Care - PPO | Admitting: Nurse Practitioner

## 2021-04-15 VITALS — BP 148/97 | HR 75 | Ht 71.0 in | Wt 267.2 lb

## 2021-04-15 DIAGNOSIS — I10 Essential (primary) hypertension: Secondary | ICD-10-CM

## 2021-04-15 DIAGNOSIS — E039 Hypothyroidism, unspecified: Secondary | ICD-10-CM | POA: Diagnosis not present

## 2021-04-15 DIAGNOSIS — E1165 Type 2 diabetes mellitus with hyperglycemia: Secondary | ICD-10-CM | POA: Diagnosis not present

## 2021-04-15 DIAGNOSIS — E2740 Unspecified adrenocortical insufficiency: Secondary | ICD-10-CM

## 2021-04-15 DIAGNOSIS — E782 Mixed hyperlipidemia: Secondary | ICD-10-CM | POA: Diagnosis not present

## 2021-04-15 DIAGNOSIS — E559 Vitamin D deficiency, unspecified: Secondary | ICD-10-CM

## 2021-04-15 LAB — POCT GLYCOSYLATED HEMOGLOBIN (HGB A1C): HbA1c, POC (controlled diabetic range): 6.1 % (ref 0.0–7.0)

## 2021-04-15 MED ORDER — LEVOTHYROXINE SODIUM 25 MCG PO TABS: ORAL_TABLET | ORAL | 3 refills | Status: DC

## 2021-04-15 MED ORDER — GLIPIZIDE ER 5 MG PO TB24: 5.0000 mg | ORAL_TABLET | Freq: Every day | ORAL | 3 refills | Status: DC

## 2021-04-15 MED ORDER — FLUDROCORTISONE ACETATE 0.1 MG PO TABS: 0.1000 mg | ORAL_TABLET | Freq: Every day | ORAL | 1 refills | Status: DC

## 2021-04-15 MED ORDER — LEVOTHYROXINE SODIUM 200 MCG PO TABS
200.0000 ug | ORAL_TABLET | Freq: Every day | ORAL | 3 refills | Status: DC
Start: 2021-04-15 — End: 2021-10-14

## 2021-04-15 MED ORDER — GLUCOSE BLOOD VI STRP: ORAL_STRIP | 12 refills | Status: DC

## 2021-04-15 MED ORDER — METFORMIN HCL 1000 MG PO TABS: ORAL_TABLET | ORAL | 3 refills | Status: DC

## 2021-04-15 MED ORDER — HYDROCORTISONE 10 MG PO TABS: ORAL_TABLET | ORAL | 1 refills | Status: DC

## 2021-04-15 NOTE — Progress Notes (Signed)
04/15/2021, 4:09 PM   Endocrinology follow-up note   Subjective:    Patient ID: Jeffery Daniels, male    DOB: 03/15/1966.  Jeffery Daniels is being seen in follow-up for management of currently uncontrolled type 2 diabetes, adrenal insufficiency, hypothyroidism, hyperlipidemia, hypertension.    PMD:   Assunta Found, MD.   Past Medical History:  Diagnosis Date   Addison disease [facility])    Hyperlipidemia    Past Surgical History:  Procedure Laterality Date   HERNIA REPAIR     Social History   Socioeconomic History   Marital status: Single    Spouse name: Not on file   Number of children: Not on file   Years of education: Not on file   Highest education level: Not on file  Occupational History   Not on file  Tobacco Use   Smoking status: Every Day    Packs/day: 1.50    Years: 25.00    Pack years: 37.50    Types: Cigarettes   Smokeless tobacco: Never  Vaping Use   Vaping Use: Never used  Substance and Sexual Activity   Alcohol use: Yes    Comment: Occasional   Drug use: Never   Sexual activity: Never  Other Topics Concern   Not on file  Social History Narrative   Not on file   Social Determinants of Health   Financial Resource Strain: Not on file  Food Insecurity: Not on file  Transportation Needs: Not on file  Physical Activity: Not on file  Stress: Not on file  Social Connections: Not on file   Outpatient Encounter Medications as of 04/15/2021  Medication Sig   Cholecalciferol (VITAMIN D3) 125 MCG (5000 UT) CAPS Take 1 capsule (5,000 Units total) by mouth daily.   fenofibrate (TRICOR) 145 MG tablet Take 1 tablet (145 mg total) by mouth daily.   hydrochlorothiazide (HYDRODIURIL) 25 MG tablet TAKE ONE (1) TABLET BY MOUTH EVERY DAY   LANCETS ULTRA FINE MISC 1 each by Does not apply route 4 (four) times daily.   lisinopril (ZESTRIL) 5 MG tablet Take 1 tablet (5 mg total) by mouth daily.   simvastatin (ZOCOR) 20 MG tablet TAKE ONE  (1) TABLET BY MOUTH EVERY DAY   [DISCONTINUED] fludrocortisone (FLORINEF) 0.1 MG tablet Take 1 tablet (0.1 mg total) by mouth daily.   [DISCONTINUED] glipiZIDE (GLUCOTROL XL) 5 MG 24 hr tablet Take 1 tablet (5 mg total) by mouth daily with breakfast.   [DISCONTINUED] glucose blood test strip Use as instructed   [DISCONTINUED] hydrocortisone (CORTEF) 10 MG tablet TAKE ONE AND ONE-HALF TABLET (15MG  TOTAL) EVERY MORNING AND ONE TABLET (10MG  TOTAL) EVERYDAY AT NOON   [DISCONTINUED] levothyroxine (SYNTHROID) 200 MCG tablet Take 1 tablet (200 mcg total) by mouth daily before breakfast.   [DISCONTINUED] levothyroxine (SYNTHROID) 25 MCG tablet TAKE ONE TABLET ( TOTAL) BY MOUTH DAILY BEFORE BREAKFAST   [DISCONTINUED] metFORMIN (GLUCOPHAGE) 1000 MG tablet TAKE ONE TABLET (1000MG ) BY MOUTH TWICE DAILY WITH A MEAL   fludrocortisone (FLORINEF) 0.1 MG tablet Take 1 tablet (0.1 mg total) by mouth daily.   glipiZIDE (GLUCOTROL XL) 5 MG 24 hr tablet Take 1 tablet (5 mg total) by mouth daily with breakfast.   glucose blood test strip Use as instructed   hydrocortisone (CORTEF) 10 MG tablet TAKE ONE AND ONE-HALF TABLET (15MG  TOTAL) EVERY MORNING AND ONE TABLET (10MG  TOTAL) EVERYDAY AT NOON   levothyroxine (SYNTHROID) 200 MCG tablet Take 1 tablet (200  mcg total) by mouth daily before breakfast.   levothyroxine (SYNTHROID) 25 MCG tablet TAKE ONE TABLET ( TOTAL) BY MOUTH DAILY BEFORE BREAKFAST   metFORMIN (GLUCOPHAGE) 1000 MG tablet TAKE ONE TABLET (1000MG ) BY MOUTH TWICE DAILY WITH A MEAL   No facility-administered encounter medications on file as of 04/15/2021.    ALLERGIES: Allergies  Allergen Reactions   Bee Venom     VACCINATION STATUS:  There is no immunization history on file for this patient.  Diabetes He presents for his follow-up diabetic visit. He has type 2 diabetes mellitus. Onset time: He diagnosed at age of 17 years. His disease course has been stable. There are no hypoglycemic  associated symptoms. Pertinent negatives for hypoglycemia include no headaches, nervousness/anxiousness, seizures or tremors. Pertinent negatives for diabetes include no blurred vision, no chest pain, no fatigue, no foot ulcerations, no polydipsia, no polyuria and no weight loss. There are no hypoglycemic complications. Symptoms are stable. There are no diabetic complications. Risk factors for coronary artery disease include diabetes mellitus, dyslipidemia, male sex, obesity, hypertension, sedentary lifestyle, tobacco exposure and family history. Current diabetic treatment includes oral agent (dual therapy). He is compliant with treatment most of the time. His weight is fluctuating minimally. He is following a generally healthy diet. When asked about meal planning, he reported none. He has not had a previous visit with a dietitian. He rarely participates in exercise. (He presents today with no meter or logs to review.  His POCT A1c today is 6.1%, unchanged from previous visit.  He denies any s/s of hypoglycemia.) An ACE inhibitor/angiotensin II receptor blocker is not being taken. He does not see a podiatrist.Eye exam is current.  Hyperlipidemia This is a chronic problem. The current episode started more than 1 year ago. The problem is uncontrolled. Recent lipid tests were reviewed and are variable. Exacerbating diseases include diabetes, hypothyroidism and obesity. Factors aggravating his hyperlipidemia include smoking and thiazides. Pertinent negatives include no chest pain, myalgias or shortness of breath. Current antihyperlipidemic treatment includes statins. The current treatment provides mild improvement of lipids. Compliance problems include adherence to diet and adherence to exercise.  Risk factors for coronary artery disease include dyslipidemia, diabetes mellitus, family history, obesity, male sex, hypertension and a sedentary lifestyle.  Hypertension This is a chronic problem. The current episode  started more than 1 year ago. The problem has been gradually improving since onset. The problem is controlled. Pertinent negatives include no blurred vision, chest pain, headaches, palpitations or shortness of breath. Agents associated with hypertension include thyroid hormones and steroids. Risk factors for coronary artery disease include diabetes mellitus, dyslipidemia, family history, obesity, male gender, sedentary lifestyle and smoking/tobacco exposure. Past treatments include diuretics. The current treatment provides mild improvement. There are no compliance problems.  Identifiable causes of hypertension include a hypertension causing med and a thyroid problem.  Thyroid Problem Presents for follow-up visit. Patient reports no anxiety, cold intolerance, constipation, diarrhea, fatigue, heat intolerance, palpitations, tremors or weight loss. The symptoms have been stable. His past medical history is significant for diabetes.   Review of systems  Constitutional: + Minimally fluctuating body weight,  current Body mass index is 37.27 kg/m. , no fatigue, no subjective hyperthermia, no subjective hypothermia Eyes: no blurry vision, no xerophthalmia ENT: no sore throat, no nodules palpated in throat, no dysphagia/odynophagia, no hoarseness Cardiovascular: no chest pain, no shortness of breath, no palpitations, no leg swelling Respiratory: no cough, no shortness of breath Gastrointestinal: no nausea/vomiting/diarrhea Musculoskeletal: no muscle/joint aches Skin: no rashes,  no hyperemia Neurological: no tremors, no numbness, no tingling, no dizziness Psychiatric: no depression, no anxiety   Objective:    BP (!) 148/97    Pulse 75    Ht  (1.803 m)    Wt 267 lb 3.2 oz (121.2 kg)    SpO2 99%    BMI 37.27 kg/m   Wt Readings from Last 3 Encounters:  04/15/21 267 lb 3.2 oz (121.2 kg)  12/10/20 273 lb (123.8 kg)  07/23/20 263 lb (119.3 kg)     BP Readings from Last 3 Encounters:  04/15/21 (!)  148/97  12/10/20 140/82  07/23/20 (!) 154/108     Physical Exam- Limited  Constitutional:  Body mass index is 37.27 kg/m. , not in acute distress, normal state of mind Eyes:  EOMI, no exophthalmos Neck: Supple Cardiovascular: RRR, no murmurs, rubs, or gallops, no edema Respiratory: Adequate breathing efforts, no crackles, rales, rhonchi, or wheezing Musculoskeletal: no gross deformities, strength intact in all four extremities, no gross restriction of joint movements Skin:  no rashes, no hyperemia Neurological: no tremor with outstretched hands     Recent Results (from the past 2160 hour(s))  POCT glycosylated hemoglobin (Hb A1C)     Status: Normal   Collection Time: 04/15/21  4:02 PM  Result Value Ref Range   Hemoglobin A1C     HbA1c POC (<> result, manual entry)     HbA1c, POC (prediabetic range)     HbA1c, POC (controlled diabetic range) 6.1 0.0 - 7.0 %   Lipid Panel     Component Value Date/Time   CHOL 172 12/04/2020 0836   TRIG 500 (H) 12/04/2020 0836   HDL 41 12/04/2020 0836   CHOLHDL 4.2 12/04/2020 0836   LDLCALC 56 12/04/2020 0836     Assessment & Plan:   1) Uncontrolled type 2 diabetes mellitus with hyperglycemia   - [name] has currently controlled asymptomatic type 2 DM since 55 years of age.  He presents today with no meter or logs to review.  His POCT A1c today is 6.1%, unchanged from previous visit.  He denies any s/s of hypoglycemia.  -his diabetes is complicated by adrenal insufficiency on hydrocortisone, hypothyroidism, obesity/sedentary life, chronic heavy smoking and MELVERN RAMONE remains at a high risk for more acute and chronic complications which include CAD, CVA, CKD, retinopathy, and neuropathy. These are all discussed in detail with the patient.  - Nutritional counseling repeated at each appointment due to patients tendency to fall back in to old habits.  - The patient admits there is a room for improvement in their diet and  drink choices. -  Suggestion is made for the patient to avoid simple carbohydrates from their diet including Cakes, Sweet Desserts / Pastries, Ice Cream, Soda (diet and regular), Sweet Tea, Candies, Chips, Cookies, Sweet Pastries, Store Bought Juices, Alcohol in Excess of 1-2 drinks a day, Artificial Sweeteners, Coffee Creamer, and "Sugar-free" Products. This will help patient to have stable blood glucose profile and potentially avoid unintended weight gain.   - I encouraged the patient to switch to unprocessed or minimally processed complex starch and increased protein intake (animal or plant source), fruits, and vegetables.   - Patient is advised to stick to a routine mealtimes to eat 3 meals a day and avoid unnecessary snacks (to snack only to correct hypoglycemia).  - I have approached him with the following individualized plan to manage diabetes and patient agrees:   Based on his stable glycemic profile, no  changes will be made to his medication regimen today.    -He is advised to continue Metformin 1000 mg p.o. twice daily with meals and continue Glipizide 5 mg XL daily with breakfast.   -He is encouraged to start monitoring blood glucose consistently, at least once daily, before breakfast, and call the clinic if he has readings less than 70 or greater than 200 for 3 tests in a row.  - he continues to smoke heavily, not a suitable candidate for incretin therapy.  If he continues to lose control of diabetes, he will be considered for basal insulin.   - Patient specific target  A1c;  LDL, HDL, Triglycerides, were discussed in detail.  2) BP/HTN:  His blood pressure is not controlled to target.  He is advised to continue HCTZ 25 mg po daily and Lisinopril 5 mg po daily.   3) Lipids/HPL:  His most recent lipid panel from 12/04/20 shows controlled LDL of 56 and significantly elevated triglycerides of 500.   He is advised to continue Simvastatin 20 mg po daily at bedtime.  I discussed and  initiated Fenofibrate 145 mg po daily at bedtime.  Side effects and precautions discussed with him.    4)  Weight/Diet:  His Body mass index is 37.27 kg/m.-clearly complicating his diabetes care.  He has lost 11 lbs since last visit.  He is a candidate for modest weight loss.  CDE Consult has been  initiated , exercise, and detailed carbohydrates information provided.  5) Adrenal insufficiency (HCC) - The circumstances of his diagnosis 25 years ago are not available available to review. He reports Addison's Disease. He will be considered for work up with anti 21 hydroxylase antibody on subsequent visits. -He is on a stable dose of steroids.  He is advised to continue his current steroid replacement therapy including Hydrocortisone 15 mg p.o. daily in the morning, and 10 mg daily at noon. He is advised to continue Fludrocortisone 0.1mg  po daily.   -He is advised not to discontinue his steroid replacement without first discussing it with his providers.  6) Hypothyroidism -There are no recent TFTs to review.  He is advised to continue Levothyroxine 225 mcg po daily before breakfast.       - We discussed about the correct intake of his thyroid hormone, on empty stomach at fasting, with water, separated by at least 30 minutes from breakfast and other medications,  and separated by more than 4 hours from calcium, iron, multivitamins, acid reflux medications (PPIs). -Patient is made aware of the fact that thyroid hormone replacement is needed for life, dose to be adjusted by periodic monitoring of thyroid function tests.  7) Vitamin D deficiency His most recent vitamin D level was 25.3 on 12/04/20.  He has taken OTC Vitamin D3 5000 units daily in the past but is not currently taking any.  He is advised to restart.  8) Chronic Care/Health Maintenance: -he is not on ACEI/ARB, is on Statin medications and is encouraged to continue to follow up with Ophthalmology, Dentist, Podiatrist at least yearly or  according to recommendations, and advised to Quit smoking. I have recommended yearly flu vaccine and pneumonia vaccination at least every 5 years; moderate intensity exercise for up to 150 minutes weekly; and  sleep for at least 7 hours a day.  The patient was counseled on the dangers of tobacco use, and was advised to quit.  Reviewed strategies to maximize success, including removing cigarettes and smoking materials from environment.  - I  advised patient to maintain close follow up with Assunta Found, MD for primary care needs.     I spent 30 minutes in the care of the patient today including review of labs from CMP, Lipids, Thyroid Function, Hematology (current and previous including abstractions from other facilities); face-to-face time discussing  his blood glucose readings/logs, discussing hypoglycemia and hyperglycemia episodes and symptoms, medications doses, his options of short and long term treatment based on the latest standards of care / guidelines;  discussion about incorporating lifestyle medicine;  and documenting the encounter.    Please refer to Patient Instructions for Blood Glucose Monitoring and Insulin/Medications Dosing Guide"  in media tab for additional information. Please  also refer to " Patient Self Inventory" in the Media  tab for reviewed elements of pertinent patient history.  [name] participated in the discussions, expressed understanding, and voiced agreement with the above plans.  All questions were answered to his satisfaction. he is encouraged to contact clinic should he have any questions or concerns prior to his return visit.   Follow up plan: - Return in about 6 months (around 10/14/2021) for Diabetes F/U- A1c and UM in office, Thyroid follow up, Previsit labs, Bring meter and logs.   Ronny Bacon, [facility] [address] [place], [place] [address] Phone: [phone] Fax: [phone]  04/15/2021, 4:09  PM

## 2021-04-15 NOTE — Patient Instructions (Signed)

## 2021-05-05 ENCOUNTER — Telehealth: Payer: Self-pay

## 2021-05-05 ENCOUNTER — Other Ambulatory Visit: Payer: Self-pay

## 2021-05-05 MED ORDER — BLOOD GLUCOSE METER KIT: PACK | 0 refills | Status: AC

## 2021-05-05 NOTE — Telephone Encounter (Signed)
Pt called and he is using the contour next. He said a new meter and supplies sent to his pharmacy will be fine with him.

## 2021-05-05 NOTE — Telephone Encounter (Signed)
Called patient and left a detailed voice message for patient to call back to let us know what kind of blood glucose meter he is using.   His insurance is not covering the Contour Next test strips and we may have to send a new blood glucose meter with supplies to his pharmacy for patient to use.

## 2021-05-05 NOTE — Telephone Encounter (Signed)
Generic meter, strips, and lancets sent to pharmacy for patient/insurance preference.

## 2021-06-08 ENCOUNTER — Other Ambulatory Visit: Payer: Self-pay | Admitting: Nurse Practitioner

## 2021-08-17 ENCOUNTER — Other Ambulatory Visit: Payer: Self-pay | Admitting: Nurse Practitioner

## 2021-10-09 LAB — TSH: TSH: 3.74 u[IU]/mL (ref 0.450–4.500)

## 2021-10-09 LAB — COMPREHENSIVE METABOLIC PANEL
ALT: 39 IU/L (ref 0–44)
AST: 26 IU/L (ref 0–40)
Albumin/Globulin Ratio: 2 (ref 1.2–2.2)
Albumin: 4.8 g/dL (ref 3.8–4.9)
Alkaline Phosphatase: 56 IU/L (ref 44–121)
BUN/Creatinine Ratio: 12 (ref 9–20)
BUN: 12 mg/dL (ref 6–24)
Bilirubin Total: 0.4 mg/dL (ref 0.0–1.2)
CO2: 24 mmol/L (ref 20–29)
Calcium: 9.3 mg/dL (ref 8.7–10.2)
Chloride: 103 mmol/L (ref 96–106)
Creatinine, Ser: 0.97 mg/dL (ref 0.76–1.27)
Globulin, Total: 2.4 g/dL (ref 1.5–4.5)
Glucose: 127 mg/dL — ABNORMAL HIGH (ref 70–99)
Potassium: 4.3 mmol/L (ref 3.5–5.2)
Sodium: 143 mmol/L (ref 134–144)
Total Protein: 7.2 g/dL (ref 6.0–8.5)
eGFR: 92 mL/min/{1.73_m2} (ref >59)

## 2021-10-09 LAB — T4, FREE: Free T4: 1.52 ng/dL (ref 0.82–1.77)

## 2021-10-13 NOTE — Patient Instructions (Signed)
Diabetes Mellitus and Foot Care Foot care is an important part of your health, especially when you have diabetes. Diabetes may cause you to have problems because of poor blood flow (circulation) to your feet and legs, which can cause your skin to: Become thinner and drier. Break more easily. Heal more slowly. Peel and crack. You may also have nerve damage (neuropathy) in your legs and feet, causing decreased feeling in them. This means that you may not notice minor injuries to your feet that could lead to more serious problems. Noticing and addressing any potential problems early is the best way to prevent future foot problems. How to care for your feet Foot hygiene  Wash your feet daily with warm water and mild soap. Do not use hot water. Then, pat your feet and the areas between your toes until they are completely dry. Do not soak your feet as this can dry your skin. Trim your toenails straight across. Do not dig under them or around the cuticle. File the edges of your nails with an emery board or nail file. Apply a moisturizing lotion or petroleum jelly to the skin on your feet and to dry, brittle toenails. Use lotion that does not contain alcohol and is unscented. Do not apply lotion between your toes. Shoes and socks Wear clean socks or stockings every day. Make sure they are not too tight. Do not wear knee-high stockings since they may decrease blood flow to your legs. Wear shoes that fit properly and have enough cushioning. Always look in your shoes before you put them on to be sure there are no objects inside. To break in new shoes, wear them for just a few hours a day. This prevents injuries on your feet. Wounds, scrapes, corns, and calluses  Check your feet daily for blisters, cuts, bruises, sores, and redness. If you cannot see the bottom of your feet, use a mirror or ask someone for help. Do not cut corns or calluses or try to remove them with medicine. If you find a minor scrape,  cut, or break in the skin on your feet, keep it and the skin around it clean and dry. You may clean these areas with mild soap and water. Do not clean the area with peroxide, alcohol, or iodine. If you have a wound, scrape, corn, or callus on your foot, look at it several times a day to make sure it is healing and not infected. Check for: Redness, swelling, or pain. Fluid or blood. Warmth. Pus or a bad smell. General tips Do not cross your legs. This may decrease blood flow to your feet. Do not use heating pads or hot water bottles on your feet. They may burn your skin. If you have lost feeling in your feet or legs, you may not know this is happening until it is too late. Protect your feet from hot and cold by wearing shoes, such as at the beach or on hot pavement. Schedule a complete foot exam at least once a year (annually) or more often if you have foot problems. Report any cuts, sores, or bruises to your health care provider immediately. Where to find more information American Diabetes Association: www.diabetes.org Association of Diabetes Care & Education Specialists: www.diabeteseducator.org Contact a health care provider if: You have a medical condition that increases your risk of infection and you have any cuts, sores, or bruises on your feet. You have an injury that is not healing. You have redness on your legs or feet. You   feel burning or tingling in your legs or feet. You have pain or cramps in your legs and feet. Your legs or feet are numb. Your feet always feel cold. You have pain around any toenails. Get help right away if: You have a wound, scrape, corn, or callus on your foot and: You have pain, swelling, or redness that gets worse. You have fluid or blood coming from the wound, scrape, corn, or callus. Your wound, scrape, corn, or callus feels warm to the touch. You have pus or a bad smell coming from the wound, scrape, corn, or callus. You have a fever. You have a red  line going up your leg. Summary Check your feet every day for blisters, cuts, bruises, sores, and redness. Apply a moisturizing lotion or petroleum jelly to the skin on your feet and to dry, brittle toenails. Wear shoes that fit properly and have enough cushioning. If you have foot problems, report any cuts, sores, or bruises to your health care provider immediately. Schedule a complete foot exam at least once a year (annually) or more often if you have foot problems. This information is not intended to replace advice given to you by your health care provider. Make sure you discuss any questions you have with your health care provider. Document Revised: 10/31/2019 Document Reviewed: 10/31/2019 Elsevier Patient Education  2023 [employer]  

## 2021-10-14 ENCOUNTER — Ambulatory Visit: Payer: Commercial Managed Care - PPO | Admitting: Nurse Practitioner

## 2021-10-14 ENCOUNTER — Encounter: Payer: Self-pay | Admitting: Nurse Practitioner

## 2021-10-14 VITALS — BP 141/85 | HR 76 | Ht 71.0 in | Wt 278.0 lb

## 2021-10-14 DIAGNOSIS — E559 Vitamin D deficiency, unspecified: Secondary | ICD-10-CM | POA: Diagnosis not present

## 2021-10-14 DIAGNOSIS — I10 Essential (primary) hypertension: Secondary | ICD-10-CM

## 2021-10-14 DIAGNOSIS — E782 Mixed hyperlipidemia: Secondary | ICD-10-CM

## 2021-10-14 DIAGNOSIS — E039 Hypothyroidism, unspecified: Secondary | ICD-10-CM | POA: Diagnosis not present

## 2021-10-14 DIAGNOSIS — E1165 Type 2 diabetes mellitus with hyperglycemia: Secondary | ICD-10-CM | POA: Diagnosis not present

## 2021-10-14 LAB — POCT UA - MICROALBUMIN
Albumin/Creatinine Ratio, Urine, POC: <30
Creatinine, POC: 100 mg/dL
Microalbumin Ur, POC: 10 mg/L

## 2021-10-14 LAB — POCT GLYCOSYLATED HEMOGLOBIN (HGB A1C): HbA1c, POC (controlled diabetic range): 6.9 % (ref 0.0–7.0)

## 2021-10-14 MED ORDER — LEVOTHYROXINE SODIUM 25 MCG PO TABS: ORAL_TABLET | ORAL | 3 refills | Status: DC

## 2021-10-14 MED ORDER — FENOFIBRATE 145 MG PO TABS: 145.0000 mg | ORAL_TABLET | Freq: Every day | ORAL | 3 refills | Status: DC

## 2021-10-14 MED ORDER — FLUDROCORTISONE ACETATE 0.1 MG PO TABS: 0.1000 mg | ORAL_TABLET | Freq: Every day | ORAL | 1 refills | Status: DC

## 2021-10-14 MED ORDER — GLIPIZIDE ER 5 MG PO TB24: 5.0000 mg | ORAL_TABLET | Freq: Every day | ORAL | 3 refills | Status: DC

## 2021-10-14 MED ORDER — METFORMIN HCL 1000 MG PO TABS: ORAL_TABLET | ORAL | 3 refills | Status: DC

## 2021-10-14 MED ORDER — SIMVASTATIN 20 MG PO TABS: 20.0000 mg | ORAL_TABLET | Freq: Every day | ORAL | 3 refills | Status: DC

## 2021-10-14 MED ORDER — LISINOPRIL 5 MG PO TABS: ORAL_TABLET | ORAL | 3 refills | Status: DC

## 2021-10-14 MED ORDER — LEVOTHYROXINE SODIUM 200 MCG PO TABS: 200.0000 ug | ORAL_TABLET | Freq: Every day | ORAL | 3 refills | Status: DC

## 2021-10-14 MED ORDER — HYDROCHLOROTHIAZIDE 25 MG PO TABS
25.0000 mg | ORAL_TABLET | Freq: Every day | ORAL | 3 refills | Status: DC
Start: 2021-10-14 — End: 2022-05-09

## 2021-10-14 MED ORDER — HYDROCORTISONE 10 MG PO TABS: ORAL_TABLET | ORAL | 1 refills | Status: DC

## 2021-10-14 NOTE — Progress Notes (Signed)
10/14/2021, 4:20 PM   Endocrinology follow-up note   Subjective:    Patient ID: Jeffery Daniels, male    DOB: 05-25-1965.  Jeffery Daniels is being seen in follow-up for management of currently uncontrolled type 2 diabetes, adrenal insufficiency, hypothyroidism, hyperlipidemia, hypertension.    PMD:   Sharilyn Sites, MD.   Past Medical History:  Diagnosis Date   Addison disease [facility])    Hyperlipidemia    Past Surgical History:  Procedure Laterality Date   HERNIA REPAIR     Social History   Socioeconomic History   Marital status: Single    Spouse name: Not on file   Number of children: Not on file   Years of education: Not on file   Highest education level: Not on file  Occupational History   Not on file  Tobacco Use   Smoking status: Every Day    Packs/day: 1.50    Years: 25.00    Total pack years: 37.50    Types: Cigarettes   Smokeless tobacco: Never  Vaping Use   Vaping Use: Never used  Substance and Sexual Activity   Alcohol use: Yes    Comment: Occasional   Drug use: Never   Sexual activity: Never  Other Topics Concern   Not on file  Social History Narrative   Not on file   Social Determinants of Health   Financial Resource Strain: Not on file  Food Insecurity: Not on file  Transportation Needs: Not on file  Physical Activity: Not on file  Stress: Not on file  Social Connections: Not on file   Outpatient Encounter Medications as of 10/14/2021  Medication Sig   blood glucose meter kit and supplies Dispense based on patient and insurance preference. Check blood glucose 1 time daily before breakfast.   Cholecalciferol (VITAMIN D3) 125 MCG (5000 UT) CAPS Take 1 capsule (5,000 Units total) by mouth daily.   fenofibrate (TRICOR) 145 MG tablet Take 1 tablet (145 mg total) by mouth daily.   fludrocortisone (FLORINEF) 0.1 MG tablet Take 1 tablet (0.1 mg total) by mouth daily.   glipiZIDE (GLUCOTROL XL) 5 MG 24 hr tablet Take 1  tablet (5 mg total) by mouth daily with breakfast.   glucose blood test strip Use as instructed   hydrochlorothiazide (HYDRODIURIL) 25 MG tablet Take 1 tablet (25 mg total) by mouth daily.   hydrocortisone (CORTEF) 10 MG tablet TAKE ONE AND ONE-HALF TABLET (15MG TOTAL) EVERY MORNING AND ONE TABLET (10MG TOTAL) EVERYDAY AT NOON   LANCETS ULTRA FINE MISC 1 each by Does not apply route 4 (four) times daily.   levothyroxine (SYNTHROID) 200 MCG tablet Take 1 tablet (200 mcg total) by mouth daily before breakfast.   levothyroxine (SYNTHROID) 25 MCG tablet TAKE ONE TABLET (25MCG TOTAL) BY MOUTH DAILY BEFORE BREAKFAST   lisinopril (ZESTRIL) 5 MG tablet TAKE ONE TABLET (5MG TOTAL) BY MOUTH DAILY   metFORMIN (GLUCOPHAGE) 1000 MG tablet TAKE ONE TABLET (1000MG) BY MOUTH TWICE DAILY WITH A MEAL   simvastatin (ZOCOR) 20 MG tablet Take 1 tablet (20 mg total) by mouth daily at 6 PM.   [DISCONTINUED] fenofibrate (TRICOR) 145 MG tablet Take 1 tablet (145 mg total) by mouth daily.   [DISCONTINUED] fludrocortisone (FLORINEF) 0.1 MG tablet Take 1 tablet (0.1 mg total) by mouth daily.   [DISCONTINUED] glipiZIDE (GLUCOTROL XL) 5 MG 24 hr tablet Take 1 tablet (5 mg total) by mouth daily with breakfast.   [DISCONTINUED] hydrochlorothiazide (  HYDRODIURIL) 25 MG tablet TAKE ONE (1) TABLET BY MOUTH EVERY DAY   [DISCONTINUED] hydrocortisone (CORTEF) 10 MG tablet TAKE ONE AND ONE-HALF TABLET (15MG TOTAL) EVERY MORNING AND ONE TABLET (10MG TOTAL) EVERYDAY AT NOON   [DISCONTINUED] levothyroxine (SYNTHROID) 200 MCG tablet Take 1 tablet (200 mcg total) by mouth daily before breakfast.   [DISCONTINUED] levothyroxine (SYNTHROID) 25 MCG tablet TAKE ONE TABLET (25MCG TOTAL) BY MOUTH DAILY BEFORE BREAKFAST   [DISCONTINUED] lisinopril (ZESTRIL) 5 MG tablet TAKE ONE TABLET (5MG TOTAL) BY MOUTH DAILY   [DISCONTINUED] metFORMIN (GLUCOPHAGE) 1000 MG tablet TAKE ONE TABLET (1000MG) BY MOUTH TWICE DAILY WITH A MEAL   [DISCONTINUED]  simvastatin (ZOCOR) 20 MG tablet TAKE ONE (1) TABLET BY MOUTH EVERY DAY   No facility-administered encounter medications on file as of 10/14/2021.    ALLERGIES: Allergies  Allergen Reactions   Bee Venom     VACCINATION STATUS:  There is no immunization history on file for this patient.  Diabetes He presents for his follow-up diabetic visit. He has type 2 diabetes mellitus. Onset time: He diagnosed at age of 35 years. His disease course has been stable. There are no hypoglycemic associated symptoms. Pertinent negatives for hypoglycemia include no headaches, nervousness/anxiousness, seizures or tremors. Pertinent negatives for diabetes include no blurred vision, no chest pain, no fatigue, no foot ulcerations, no polydipsia, no polyuria and no weight loss. There are no hypoglycemic complications. Symptoms are stable. There are no diabetic complications. Risk factors for coronary artery disease include diabetes mellitus, dyslipidemia, male sex, obesity, hypertension, sedentary lifestyle, tobacco exposure and family history. Current diabetic treatment includes oral agent (dual therapy). He is compliant with treatment most of the time. His weight is increasing steadily. He is following a generally healthy diet. When asked about meal planning, he reported none. He has not had a previous visit with a dietitian. He rarely participates in exercise. His breakfast blood glucose range is generally 140-180 mg/dl. His overall blood glucose range is 140-180 mg/dl. (He presents today with his meter, no logs, showing above target fasting glycemic profile.  His POCT A1c today is 6.9%, increasing slightly from last visit of 6.1%.  He notes he recently celebrated his bday and is now trying to get back on track.  Analysis of his meter shows 7-day average of 160, 14-day average of 161, 30-day average of 149.  He denies any s/s of hypoglycemia.) An ACE inhibitor/angiotensin II receptor blocker is not being taken. He does not  see a podiatrist.Eye exam is current.  Hyperlipidemia This is a chronic problem. The current episode started more than 1 year ago. The problem is uncontrolled. Recent lipid tests were reviewed and are variable. Exacerbating diseases include diabetes, hypothyroidism and obesity. Factors aggravating his hyperlipidemia include smoking and thiazides. Pertinent negatives include no chest pain, myalgias or shortness of breath. Current antihyperlipidemic treatment includes statins. The current treatment provides mild improvement of lipids. Compliance problems include adherence to diet and adherence to exercise.  Risk factors for coronary artery disease include dyslipidemia, diabetes mellitus, family history, obesity, male sex, hypertension and a sedentary lifestyle.  Hypertension This is a chronic problem. The current episode started more than 1 year ago. The problem has been gradually improving since onset. The problem is uncontrolled. Pertinent negatives include no blurred vision, chest pain, headaches, palpitations or shortness of breath. Agents associated with hypertension include thyroid hormones and steroids. Risk factors for coronary artery disease include diabetes mellitus, dyslipidemia, family history, obesity, male gender, sedentary lifestyle and smoking/tobacco exposure. Past  treatments include diuretics. The current treatment provides mild improvement. There are no compliance problems.  Identifiable causes of hypertension include a hypertension causing med and a thyroid problem.  Thyroid Problem Presents for follow-up visit. Symptoms include weight gain. Patient reports no anxiety, cold intolerance, constipation, diarrhea, fatigue, heat intolerance, palpitations, tremors or weight loss. The symptoms have been stable. His past medical history is significant for diabetes and hyperlipidemia.    Review of systems  Constitutional: + increasing body weight,  current Body mass index is 38.77 kg/m. , no  fatigue, no subjective hyperthermia, no subjective hypothermia Eyes: no blurry vision, no xerophthalmia ENT: no sore throat, no nodules palpated in throat, no dysphagia/odynophagia, no hoarseness Cardiovascular: no chest pain, no shortness of breath, no palpitations, no leg swelling Respiratory: no cough, no shortness of breath Gastrointestinal: no nausea/vomiting/diarrhea Musculoskeletal: no muscle/joint aches Skin: no rashes, no hyperemia Neurological: no tremors, no numbness, no tingling, no dizziness Psychiatric: no depression, no anxiety   Objective:    BP (!) 141/85   Pulse 76   Ht _0  (1.803 m)   Wt 278 lb (126.1 kg)   BMI 38.77 kg/m   Wt Readings from Last 3 Encounters:  10/14/21 278 lb (126.1 kg)  04/15/21 267 lb 3.2 oz (121.2 kg)  12/10/20 273 lb (123.8 kg)     BP Readings from Last 3 Encounters:  10/14/21 (!) 141/85  04/15/21 (!) 148/97  12/10/20 140/82     Physical Exam- Limited  Constitutional:  Body mass index is 38.77 kg/m. , not in acute distress, normal state of mind Eyes:  EOMI, no exophthalmos Neck: Supple Cardiovascular: RRR, no murmurs, rubs, or gallops, no edema Respiratory: Adequate breathing efforts, no crackles, rales, rhonchi, or wheezing Musculoskeletal: no gross deformities, strength intact in all four extremities, no gross restriction of joint movements Skin:  no rashes, no hyperemia Neurological: no tremor with outstretched hands     Recent Results (from the past 2160 hour(s))  TSH     Status: None   Collection Time: 10/08/21  9:00 AM  Result Value Ref Range   TSH 3.740 0.450 - 4.500 uIU/mL  T4, free     Status: None   Collection Time: 10/08/21  9:00 AM  Result Value Ref Range   Free T4 1.52 0.82 - 1.77 ng/dL  Comprehensive metabolic panel     Status: Abnormal   Collection Time: 10/08/21  9:00 AM  Result Value Ref Range   Glucose 127 (H) 70 - 99 mg/dL   BUN 12 6 - 24 mg/dL   Creatinine, Ser 0.97 0.76 - 1.27 mg/dL   eGFR  92 >59 mL/min/1.73   BUN/Creatinine Ratio 12 9 - 20   Sodium 143 134 - 144 mmol/L   Potassium 4.3 3.5 - 5.2 mmol/L   Chloride 103 96 - 106 mmol/L   CO2 24 20 - 29 mmol/L   Calcium 9.3 8.7 - 10.2 mg/dL   Total Protein 7.2 6.0 - 8.5 g/dL   Albumin 4.8 3.8 - 4.9 g/dL   Globulin, Total 2.4 1.5 - 4.5 g/dL   Albumin/Globulin Ratio 2.0 1.2 - 2.2   Bilirubin Total 0.4 0.0 - 1.2 mg/dL   Alkaline Phosphatase 56 44 - 121 IU/L   AST 26 0 - 40 IU/L   ALT 39 0 - 44 IU/L  POCT UA - Microalbumin     Status: Normal   Collection Time: 10/14/21  4:13 PM  Result Value Ref Range   Microalbumin Ur, POC 10 mg/L  Creatinine, POC 100 mg/dL   Albumin/Creatinine Ratio, Urine, POC <30   HgB A1c     Status: Normal   Collection Time: 10/14/21  4:14 PM  Result Value Ref Range   Hemoglobin A1C     HbA1c POC (<> result, manual entry)     HbA1c, POC (prediabetic range)     HbA1c, POC (controlled diabetic range) 6.9 0.0 - 7.0 %   Lipid Panel     Component Value Date/Time   CHOL 172 12/04/2020 0836   TRIG 500 (H) 12/04/2020 0836   HDL 41 12/04/2020 0836   CHOLHDL 4.2 12/04/2020 0836   LDLCALC 56 12/04/2020 0836     Assessment & Plan:   1) Uncontrolled type 2 diabetes mellitus with hyperglycemia   - Jeffery Daniels has currently controlled asymptomatic type 2 DM since 56 years of age.  He presents today with his meter, no logs, showing above target fasting glycemic profile.  His POCT A1c today is 6.9%, increasing slightly from last visit of 6.1%.  He notes he recently celebrated his bday and is now trying to get back on track.  Analysis of his meter shows 7-day average of 160, 14-day average of 161, 30-day average of 149.  He denies any s/s of hypoglycemia.  -his diabetes is complicated by adrenal insufficiency on hydrocortisone, hypothyroidism, obesity/sedentary life, chronic heavy smoking and Jeffery Daniels remains at a high risk for more acute and chronic complications which include CAD, CVA, CKD,  retinopathy, and neuropathy. These are all discussed in detail with the patient.  - Nutritional counseling repeated at each appointment due to patients tendency to fall back in to old habits.  - The patient admits there is a room for improvement in their diet and drink choices. -  Suggestion is made for the patient to avoid simple carbohydrates from their diet including Cakes, Sweet Desserts / Pastries, Ice Cream, Soda (diet and regular), Sweet Tea, Candies, Chips, Cookies, Sweet Pastries, Store Bought Juices, Alcohol in Excess of 1-2 drinks a day, Artificial Sweeteners, Coffee Creamer, and "Sugar-free" Products. This will help patient to have stable blood glucose profile and potentially avoid unintended weight gain.   - I encouraged the patient to switch to unprocessed or minimally processed complex starch and increased protein intake (animal or plant source), fruits, and vegetables.   - Patient is advised to stick to a routine mealtimes to eat 3 meals a day and avoid unnecessary snacks (to snack only to correct hypoglycemia).  - I have approached him with the following individualized plan to manage diabetes and patient agrees:   Based on his stable glycemic profile, no changes will be made to his medication regimen today.    -He is advised to continue Metformin 1000 mg p.o. twice daily with meals and continue Glipizide 5 mg XL daily with breakfast.   -He is encouraged to continue monitoring blood glucose consistently, at least once daily, before breakfast, and call the clinic if he has readings less than 70 or greater than 200 for 3 tests in a row.  - he continues to smoke heavily, not a suitable candidate for incretin therapy.  If he continues to lose control of diabetes, he will be considered for basal insulin.   - Patient specific target  A1c;  LDL, HDL, Triglycerides, were discussed in detail.  2) BP/HTN:  His blood pressure is not controlled to target.  He is advised to continue HCTZ  25 mg po daily and Lisinopril 5 mg po daily (  refills sent in today).  3) Lipids/HPL:  His most recent lipid panel from 12/04/20 shows controlled LDL of 56 and significantly elevated triglycerides of 500.   He is advised to continue Simvastatin 20 mg po daily at bedtime and Fenofibrate 145 mg po daily at bedtime.  Side effects and precautions discussed with him.  Will recheck lipid panel prior to next visit.  4)  Weight/Diet:  His Body mass index is 38.77 kg/m.-clearly complicating his diabetes care.  He has lost 11 lbs since last visit.  He is a candidate for modest weight loss.  CDE Consult has been  initiated , exercise, and detailed carbohydrates information provided.  5) Adrenal insufficiency (HCC) - The circumstances of his diagnosis 25 years ago are not available available to review. He reports Addison's Disease.  -He is on a stable dose of steroids.  He is advised to continue his current steroid replacement therapy including Hydrocortisone 15 mg p.o. daily in the morning, and 10 mg daily at noon. He is advised to continue Fludrocortisone 0.72m po daily.   -He is advised not to discontinue his steroid replacement without first discussing it with his providers.  6) Hypothyroidism -His previsit thyroid function tests are consistent with appropriate hormone replacement.  He is advised to continue Levothyroxine 225 mcg po daily before breakfast.       - We discussed about the correct intake of his thyroid hormone, on empty stomach at fasting, with water, separated by at least 30 minutes from breakfast and other medications,  and separated by more than 4 hours from calcium, iron, multivitamins, acid reflux medications (PPIs). -Patient is made aware of the fact that thyroid hormone replacement is needed for life, dose to be adjusted by periodic monitoring of thyroid function tests.  7) Vitamin D deficiency His most recent vitamin D level was 25.3 on 12/04/20.  He has taken OTC Vitamin D3 5000  units daily in the past but is not currently taking any.  Will recheck Vitamin D prior to next visit.  8) Chronic Care/Health Maintenance: -he is not on ACEI/ARB, is on Statin medications and is encouraged to continue to follow up with Ophthalmology, Dentist, Podiatrist at least yearly or according to recommendations, and advised to Quit smoking. I have recommended yearly flu vaccine and pneumonia vaccination at least every 5 years; moderate intensity exercise for up to 150 minutes weekly; and  sleep for at least 7 hours a day.  The patient was counseled on the dangers of tobacco use, and was advised to quit.  Reviewed strategies to maximize success, including removing cigarettes and smoking materials from environment.  - I advised patient to maintain close follow up with GSharilyn Sites MD for primary care needs.     I spent 30 minutes in the care of the patient today including review of labs from [facility], Thyroid Function, Hematology (current and previous including abstractions from other facilities); face-to-face time discussing  his blood glucose readings/logs, discussing hypoglycemia and hyperglycemia episodes and symptoms, medications doses, his options of short and long term treatment based on the latest standards of care / guidelines;  discussion about incorporating lifestyle medicine;  and documenting the encounter.    Please refer to Patient Instructions for Blood Glucose Monitoring and Insulin/Medications Dosing Guide"  in media tab for additional information. Please  also refer to " Patient Self Inventory" in the Media  tab for reviewed elements of pertinent patient history.  PKathrin Pennerparticipated in the discussions, expressed understanding, and voiced  agreement with the above plans.  All questions were answered to his satisfaction. he is encouraged to contact clinic should he have any questions or concerns prior to his return visit.   Follow up plan: - Return in about 6  months (around 04/15/2022) for Diabetes F/U with A1c in office, Previsit labs, Bring meter and logs, Thyroid follow up.   Rayetta Pigg, [facility] [address] [place], [place] [address] Phone: [phone] Fax: [phone]  10/14/2021, 4:20 PM

## 2022-04-20 ENCOUNTER — Telehealth: Payer: Self-pay | Admitting: *Deleted

## 2022-04-20 NOTE — Telephone Encounter (Signed)
Patient called and left a message on my voicemail. He states that he has appointment here tomorrow at 4 pm with Jeffery Daniels but he is out of town and will not be back until January 4th.  He would like to change  his appointment to after the 4th  of January and he will get his lab work.

## 2022-04-20 NOTE — Patient Instructions (Incomplete)

## 2022-04-21 ENCOUNTER — Ambulatory Visit: Payer: Commercial Managed Care - PPO | Admitting: Nurse Practitioner

## 2022-04-30 LAB — COMPREHENSIVE METABOLIC PANEL
ALT: 32 IU/L (ref 0–44)
AST: 27 IU/L (ref 0–40)
Albumin/Globulin Ratio: 1.9 (ref 1.2–2.2)
Albumin: 4.3 g/dL (ref 3.8–4.9)
Alkaline Phosphatase: 60 IU/L (ref 44–121)
BUN/Creatinine Ratio: 12 (ref 9–20)
BUN: 12 mg/dL (ref 6–24)
Bilirubin Total: 0.3 mg/dL (ref 0.0–1.2)
CO2: 20 mmol/L (ref 20–29)
Calcium: 9.1 mg/dL (ref 8.7–10.2)
Chloride: 102 mmol/L (ref 96–106)
Creatinine, Ser: 0.97 mg/dL (ref 0.76–1.27)
Globulin, Total: 2.3 g/dL (ref 1.5–4.5)
Glucose: 118 mg/dL — ABNORMAL HIGH (ref 70–99)
Potassium: 4.6 mmol/L (ref 3.5–5.2)
Sodium: 140 mmol/L (ref 134–144)
Total Protein: 6.6 g/dL (ref 6.0–8.5)
eGFR: 92 mL/min/{1.73_m2} (ref >59)

## 2022-04-30 LAB — TSH: TSH: 3.36 u[IU]/mL (ref 0.450–4.500)

## 2022-04-30 LAB — T4, FREE: Free T4: 1.72 ng/dL (ref 0.82–1.77)

## 2022-04-30 LAB — LIPID PANEL
Chol/HDL Ratio: 3.8 ratio (ref 0.0–5.0)
Cholesterol, Total: 152 mg/dL (ref 100–199)
HDL: 40 mg/dL (ref >39)
LDL Chol Calc (NIH): 74 mg/dL (ref 0–99)
Triglycerides: 231 mg/dL — ABNORMAL HIGH (ref 0–149)
VLDL Cholesterol Cal: 38 mg/dL (ref 5–40)

## 2022-04-30 LAB — VITAMIN D 25 HYDROXY (VIT D DEFICIENCY, FRACTURES): Vit D, 25-Hydroxy: 33.2 ng/mL (ref 30.0–100.0)

## 2022-05-06 NOTE — Patient Instructions (Signed)

## 2022-05-09 ENCOUNTER — Encounter: Payer: Self-pay | Admitting: Nurse Practitioner

## 2022-05-09 ENCOUNTER — Ambulatory Visit: Payer: Commercial Managed Care - PPO | Admitting: Nurse Practitioner

## 2022-05-09 VITALS — BP 126/86 | HR 84 | Ht 71.0 in | Wt 273.6 lb

## 2022-05-09 DIAGNOSIS — E782 Mixed hyperlipidemia: Secondary | ICD-10-CM

## 2022-05-09 DIAGNOSIS — E2740 Unspecified adrenocortical insufficiency: Secondary | ICD-10-CM

## 2022-05-09 DIAGNOSIS — E1165 Type 2 diabetes mellitus with hyperglycemia: Secondary | ICD-10-CM | POA: Diagnosis not present

## 2022-05-09 DIAGNOSIS — E559 Vitamin D deficiency, unspecified: Secondary | ICD-10-CM | POA: Diagnosis not present

## 2022-05-09 DIAGNOSIS — E039 Hypothyroidism, unspecified: Secondary | ICD-10-CM

## 2022-05-09 DIAGNOSIS — I10 Essential (primary) hypertension: Secondary | ICD-10-CM

## 2022-05-09 LAB — POCT GLYCOSYLATED HEMOGLOBIN (HGB A1C): Hemoglobin A1C: 7.6 % — AB (ref 4.0–5.6)

## 2022-05-09 MED ORDER — SIMVASTATIN 20 MG PO TABS: 20.0000 mg | ORAL_TABLET | Freq: Every day | ORAL | 3 refills | Status: DC

## 2022-05-09 MED ORDER — LEVOTHYROXINE SODIUM 200 MCG PO TABS
200.0000 ug | ORAL_TABLET | Freq: Every day | ORAL | 3 refills | Status: DC
Start: 2022-05-09 — End: 2023-02-14

## 2022-05-09 MED ORDER — HYDROCORTISONE 10 MG PO TABS: ORAL_TABLET | ORAL | 3 refills | Status: DC

## 2022-05-09 MED ORDER — HYDROCHLOROTHIAZIDE 25 MG PO TABS: 25.0000 mg | ORAL_TABLET | Freq: Every day | ORAL | 3 refills | Status: DC

## 2022-05-09 MED ORDER — GLUCOSE BLOOD VI STRP: ORAL_STRIP | 12 refills | Status: DC

## 2022-05-09 MED ORDER — FLUDROCORTISONE ACETATE 0.1 MG PO TABS: 0.1000 mg | ORAL_TABLET | Freq: Every day | ORAL | 3 refills | Status: DC

## 2022-05-09 MED ORDER — GLIPIZIDE ER 5 MG PO TB24: 5.0000 mg | ORAL_TABLET | Freq: Every day | ORAL | 3 refills | Status: DC

## 2022-05-09 MED ORDER — FENOFIBRATE 145 MG PO TABS: 145.0000 mg | ORAL_TABLET | Freq: Every day | ORAL | 3 refills | Status: DC

## 2022-05-09 MED ORDER — LEVOTHYROXINE SODIUM 25 MCG PO TABS: ORAL_TABLET | ORAL | 3 refills | Status: DC

## 2022-05-09 MED ORDER — LISINOPRIL 5 MG PO TABS: ORAL_TABLET | ORAL | 3 refills | Status: DC

## 2022-05-09 MED ORDER — METFORMIN HCL 1000 MG PO TABS: ORAL_TABLET | ORAL | 3 refills | Status: DC

## 2022-05-09 NOTE — Progress Notes (Signed)
05/09/2022, 4:42 PM   Endocrinology follow-up note   Subjective:    Patient ID: Jeffery Daniels, male    DOB: 12/10/1965.  Jeffery Daniels is being seen in follow-up for management of currently uncontrolled type 2 diabetes, adrenal insufficiency, hypothyroidism, hyperlipidemia, hypertension.    PMD:   Assunta FoundGolding, John, MD.   Past Medical History:  Diagnosis Date   Addison disease [facility](HCC)    Hyperlipidemia    Past Surgical History:  Procedure Laterality Date   HERNIA REPAIR     Social History   Socioeconomic History   Marital status: Single    Spouse name: Not on file   Number of children: Not on file   Years of education: Not on file   Highest education level: Not on file  Occupational History   Not on file  Tobacco Use   Smoking status: Every Day    Packs/day: 1.50    Years: 25.00    Total pack years: 37.50    Types: Cigarettes   Smokeless tobacco: Never  Vaping Use   Vaping Use: Never used  Substance and Sexual Activity   Alcohol use: Yes    Comment: Occasional   Drug use: Never   Sexual activity: Never  Other Topics Concern   Not on file  Social History Narrative   Not on file   Social Determinants of Health   Financial Resource Strain: Not on file  Food Insecurity: Not on file  Transportation Needs: Not on file  Physical Activity: Not on file  Stress: Not on file  Social Connections: Not on file   Outpatient Encounter Medications as of 05/09/2022  Medication Sig   blood glucose meter kit and supplies Dispense based on patient and insurance preference. Check blood glucose 1 time daily before breakfast.   Cholecalciferol (VITAMIN D3) 125 MCG (5000 UT) CAPS Take 1 capsule (5,000 Units total) by mouth daily.   LANCETS ULTRA FINE MISC 1 each by Does not apply route 4 (four) times daily.   [DISCONTINUED] fenofibrate (TRICOR) 145 MG tablet Take 1 tablet (145 mg total) by mouth daily.   [DISCONTINUED] fludrocortisone (FLORINEF) 0.1 MG  tablet Take 1 tablet (0.1 mg total) by mouth daily.   [DISCONTINUED] glipiZIDE (GLUCOTROL XL) 5 MG 24 hr tablet Take 1 tablet (5 mg total) by mouth daily with breakfast.   [DISCONTINUED] glucose blood test strip Use as instructed   [DISCONTINUED] hydrochlorothiazide (HYDRODIURIL) 25 MG tablet Take 1 tablet (25 mg total) by mouth daily.   [DISCONTINUED] hydrocortisone (CORTEF) 10 MG tablet TAKE ONE AND ONE-HALF TABLET (15MG  TOTAL) EVERY MORNING AND ONE TABLET (10MG  TOTAL) EVERYDAY AT NOON   [DISCONTINUED] levothyroxine (SYNTHROID) 200 MCG tablet Take 1 tablet (200 mcg total) by mouth daily before breakfast.   [DISCONTINUED] levothyroxine (SYNTHROID) 25 MCG tablet TAKE ONE TABLET (25MCG TOTAL) BY MOUTH DAILY BEFORE BREAKFAST   [DISCONTINUED] lisinopril (ZESTRIL) 5 MG tablet TAKE ONE TABLET (5MG  TOTAL) BY MOUTH DAILY   [DISCONTINUED] metFORMIN (GLUCOPHAGE) 1000 MG tablet TAKE ONE TABLET (1000MG ) BY MOUTH TWICE DAILY WITH A MEAL   [DISCONTINUED] simvastatin (ZOCOR) 20 MG tablet Take 1 tablet (20 mg total) by mouth daily at 6 PM.   fenofibrate (TRICOR) 145 MG tablet Take 1 tablet (145 mg total) by mouth daily.   fludrocortisone (FLORINEF) 0.1 MG tablet Take 1 tablet (0.1 mg total) by mouth daily.   glipiZIDE (GLUCOTROL XL) 5 MG 24 hr tablet Take 1 tablet (5 mg total) by  mouth daily with breakfast.   glucose blood test strip Use as instructed   hydrochlorothiazide (HYDRODIURIL) 25 MG tablet Take 1 tablet (25 mg total) by mouth daily.   hydrocortisone (CORTEF) 10 MG tablet TAKE ONE AND ONE-HALF TABLET (15MG  TOTAL) EVERY MORNING AND ONE TABLET (10MG  TOTAL) EVERYDAY AT NOON   levothyroxine (SYNTHROID) 200 MCG tablet Take 1 tablet (200 mcg total) by mouth daily before breakfast.   levothyroxine (SYNTHROID) 25 MCG tablet TAKE ONE TABLET ( TOTAL) BY MOUTH DAILY BEFORE BREAKFAST   lisinopril (ZESTRIL) 5 MG tablet TAKE ONE TABLET (5MG  TOTAL) BY MOUTH DAILY   metFORMIN (GLUCOPHAGE) 1000 MG tablet TAKE ONE  TABLET (1000MG ) BY MOUTH TWICE DAILY WITH A MEAL   simvastatin (ZOCOR) 20 MG tablet Take 1 tablet (20 mg total) by mouth daily at 6 PM.   No facility-administered encounter medications on file as of 05/09/2022.    ALLERGIES: Allergies  Allergen Reactions   Bee Venom     VACCINATION STATUS:  There is no immunization history on file for this patient.  Diabetes He presents for his follow-up diabetic visit. He has type 2 diabetes mellitus. Onset time: He diagnosed at age of 57 years. His disease course has been worsening. There are no hypoglycemic associated symptoms. Pertinent negatives for hypoglycemia include no headaches, nervousness/anxiousness, seizures or tremors. Pertinent negatives for diabetes include no blurred vision, no chest pain, no fatigue, no foot ulcerations, no polydipsia, no polyuria and no weight loss. There are no hypoglycemic complications. Symptoms are stable. There are no diabetic complications. Risk factors for coronary artery disease include diabetes mellitus, dyslipidemia, male sex, obesity, hypertension, sedentary lifestyle, tobacco exposure and family history. Current diabetic treatment includes oral agent (dual therapy). He is compliant with treatment most of the time. His weight is increasing steadily. He is following a generally healthy diet. When asked about meal planning, he reported none. He has not had a previous visit with a dietitian. He rarely participates in exercise. His breakfast blood glucose range is generally 140-180 mg/dl. His overall blood glucose range is 140-180 mg/dl. (He presents today with no meter or logs to review (forgot them at home).  His POCT A1c today is 7.6%, increasing from last visit of 6.9%.  He reports he has been traveling a lot for work and has limited dietary choices and knows he needs to do better.  He denies any hypoglycemia.) An ACE inhibitor/angiotensin II receptor blocker is not being taken. He does not see a podiatrist.Eye exam is  current.  Hyperlipidemia This is a chronic problem. The current episode started more than 1 year ago. The problem is uncontrolled. Recent lipid tests were reviewed and are variable. Exacerbating diseases include diabetes, hypothyroidism and obesity. Factors aggravating his hyperlipidemia include smoking and thiazides. Pertinent negatives include no chest pain, myalgias or shortness of breath. Current antihyperlipidemic treatment includes statins. The current treatment provides mild improvement of lipids. Compliance problems include adherence to diet and adherence to exercise.  Risk factors for coronary artery disease include dyslipidemia, diabetes mellitus, family history, obesity, male sex, hypertension and a sedentary lifestyle.  Hypertension This is a chronic problem. The current episode started more than 1 year ago. The problem has been gradually improving since onset. The problem is uncontrolled. Pertinent negatives include no blurred vision, chest pain, headaches, palpitations or shortness of breath. Agents associated with hypertension include thyroid hormones and steroids. Risk factors for coronary artery disease include diabetes mellitus, dyslipidemia, family history, obesity, male gender, sedentary lifestyle and smoking/tobacco exposure. Past  treatments include diuretics. The current treatment provides mild improvement. There are no compliance problems.  Identifiable causes of hypertension include a hypertension causing med and a thyroid problem.  Thyroid Problem Presents for follow-up visit. Symptoms include weight gain. Patient reports no anxiety, cold intolerance, constipation, diarrhea, fatigue, heat intolerance, palpitations, tremors or weight loss. The symptoms have been stable. His past medical history is significant for diabetes and hyperlipidemia.    Review of systems  Constitutional: + minimally fluctuating body weight,  current Body mass index is 38.16 kg/m. , no fatigue, no  subjective hyperthermia, no subjective hypothermia Eyes: no blurry vision, no xerophthalmia ENT: no sore throat, no nodules palpated in throat, no dysphagia/odynophagia, no hoarseness Cardiovascular: no chest pain, no shortness of breath, no palpitations, no leg swelling Respiratory: no cough, no shortness of breath Gastrointestinal: no nausea/vomiting/diarrhea Musculoskeletal: no muscle/joint aches Skin: no rashes, no hyperemia Neurological: no tremors, no numbness, no tingling, no dizziness Psychiatric: no depression, no anxiety   Objective:    BP 126/86 (BP Location: Left Arm, Patient Position: Sitting, Cuff Size: Large)   Pulse 84   Ht 5\' 11"  (1.803 m)   Wt 273 lb 9.6 oz (124.1 kg)   BMI 38.16 kg/m   Wt Readings from Last 3 Encounters:  05/09/22 273 lb 9.6 oz (124.1 kg)  10/14/21 278 lb (126.1 kg)  04/15/21 267 lb 3.2 oz (121.2 kg)     BP Readings from Last 3 Encounters:  05/09/22 126/86  10/14/21 (!) 141/85  04/15/21 (!) 148/97     Physical Exam- Limited  Constitutional:  Body mass index is 38.16 kg/m. , not in acute distress, normal state of mind Eyes:  EOMI, no exophthalmos Neck: Supple Musculoskeletal: no gross deformities, strength intact in all four extremities, no gross restriction of joint movements Skin:  no rashes, no hyperemia Neurological: no tremor with outstretched hands     Recent Results (from the past 2160 hour(s))  Comprehensive metabolic panel     Status: Abnormal   Collection Time: 04/29/22  9:41 AM  Result Value Ref Range   Glucose 118 (H) 70 - 99 mg/dL   BUN 12 6 - 24 mg/dL   Creatinine, Ser 0.97 0.76 - 1.27 mg/dL   eGFR 92 >59 mL/min/1.73   BUN/Creatinine Ratio 12 9 - 20   Sodium 140 134 - 144 mmol/L   Potassium 4.6 3.5 - 5.2 mmol/L   Chloride 102 96 - 106 mmol/L   CO2 20 20 - 29 mmol/L   Calcium 9.1 8.7 - 10.2 mg/dL   Total Protein 6.6 6.0 - 8.5 g/dL   Albumin 4.3 3.8 - 4.9 g/dL   Globulin, Total 2.3 1.5 - 4.5 g/dL    Albumin/Globulin Ratio 1.9 1.2 - 2.2   Bilirubin Total 0.3 0.0 - 1.2 mg/dL   Alkaline Phosphatase 60 44 - 121 IU/L   AST 27 0 - 40 IU/L   ALT 32 0 - 44 IU/L  Lipid panel     Status: Abnormal   Collection Time: 04/29/22  9:41 AM  Result Value Ref Range   Cholesterol, Total 152 100 - 199 mg/dL   Triglycerides 231 (H) 0 - 149 mg/dL   HDL 40 >39 mg/dL   VLDL Cholesterol Cal 38 5 - 40 mg/dL   LDL Chol Calc (NIH) 74 0 - 99 mg/dL   Chol/HDL Ratio 3.8 0.0 - 5.0 ratio    Comment:  T. Chol/HDL Ratio                                             Men  Women                               1/2 Avg.Risk  3.4    3.3                                   Avg.Risk  5.0    4.4                                2X Avg.Risk  9.6    7.1                                3X Avg.Risk 23.4   11.0   TSH     Status: None   Collection Time: 04/29/22  9:41 AM  Result Value Ref Range   TSH 3.360 0.450 - 4.500 uIU/mL  T4, free     Status: None   Collection Time: 04/29/22  9:41 AM  Result Value Ref Range   Free T4 1.72 0.82 - 1.77 ng/dL  VITAMIN D 25 Hydroxy (Vit-D Deficiency, Fractures)     Status: None   Collection Time: 04/29/22  9:41 AM  Result Value Ref Range   Vit D, 25-Hydroxy 33.2 30.0 - 100.0 ng/mL    Comment: Vitamin D deficiency has been defined by the [place] practice guideline as a level of serum 25-OH vitamin D less than 20 ng/mL (1,2). The Endocrine Society went on to further define vitamin D insufficiency as a level between 21 and 29 ng/mL (2). 1. IOM (Institute of Medicine). 2010. Dietary reference    intakes for calcium and D. [place]: The    [employer]. 2. Holick MF, [place], Bischoff-Ferrari HA, et al.    Evaluation, treatment, and prevention of vitamin D    deficiency: an Endocrine Society clinical practice    guideline. JCEM. 2011 Jul; 96(7):1911-30.   HgB A1c     Status: Abnormal   Collection Time:  05/09/22  4:09 PM  Result Value Ref Range   Hemoglobin A1C 7.6 (A) 4.0 - 5.6 %   HbA1c POC (<> result, manual entry)     HbA1c, POC (prediabetic range)     HbA1c, POC (controlled diabetic range)     Lipid Panel     Component Value Date/Time   CHOL 152 04/29/2022 0941   TRIG 231 (H) 04/29/2022 0941   HDL 40 04/29/2022 0941   CHOLHDL 3.8 04/29/2022 0941   LDLCALC 74 04/29/2022 0941     Assessment & Plan:   1) Uncontrolled type 2 diabetes mellitus with hyperglycemia   - Jeffery Daniels has currently controlled asymptomatic type 2 DM since 57 years of age.  He presents today with no meter or logs to review (forgot them at home).  His POCT A1c today is 7.6%, increasing from last visit of 6.9%.  He reports he has been traveling a lot for work and has limited dietary choices  and knows he needs to do better.  He denies any hypoglycemia.  -his diabetes is complicated by adrenal insufficiency on hydrocortisone, hypothyroidism, obesity/sedentary life, chronic heavy smoking and Jeffery Daniels remains at a high risk for more acute and chronic complications which include CAD, CVA, CKD, retinopathy, and neuropathy. These are all discussed in detail with the patient.  - Nutritional counseling repeated at each appointment due to patients tendency to fall back in to old habits.  - The patient admits there is a room for improvement in their diet and drink choices. -  Suggestion is made for the patient to avoid simple carbohydrates from their diet including Cakes, Sweet Desserts / Pastries, Ice Cream, Soda (diet and regular), Sweet Tea, Candies, Chips, Cookies, Sweet Pastries, Store Bought Juices, Alcohol in Excess of 1-2 drinks a day, Artificial Sweeteners, Coffee Creamer, and "Sugar-free" Products. This will help patient to have stable blood glucose profile and potentially avoid unintended weight gain.   - I encouraged the patient to switch to unprocessed or minimally processed complex starch and  increased protein intake (animal or plant source), fruits, and vegetables.   - Patient is advised to stick to a routine mealtimes to eat 3 meals a day and avoid unnecessary snacks (to snack only to correct hypoglycemia).  - I have approached him with the following individualized plan to manage diabetes and patient agrees:   Based on his stable glycemic profile, no changes will be made to his medication regimen today.    -He is advised to continue Metformin 1000 mg p.o. twice daily with meals and continue Glipizide 5 mg XL daily with breakfast.   -He is encouraged to continue monitoring blood glucose consistently, at least once daily, before breakfast, and call the clinic if he has readings less than 70 or greater than 200 for 3 tests in a row.  - he continues to smoke heavily, not a suitable candidate for incretin therapy.  If he continues to lose control of diabetes, he will be considered for basal insulin.   - Patient specific target  A1c;  LDL, HDL, Triglycerides, were discussed in detail.  2) BP/HTN:  His blood pressure is controlled to target.  He is advised to continue HCTZ 25 mg po daily and Lisinopril 5 mg po daily (refills sent in today).  3) Lipids/HPL:  His most recent lipid panel from 04/29/22 shows controlled LDL of 74 and elevated but improved triglycerides of 231.   He is advised to continue Simvastatin 20 mg po daily at bedtime and Fenofibrate 145 mg po daily at bedtime.  Side effects and precautions discussed with him.    4)  Weight/Diet:  His Body mass index is 38.16 kg/m.-clearly complicating his diabetes care.  He has lost 11 lbs since last visit.  He is a candidate for modest weight loss.  CDE Consult has been  initiated , exercise, and detailed carbohydrates information provided.  5) Adrenal insufficiency (HCC) - The circumstances of his diagnosis 25 years ago are not available available to review. He reports Addison's Disease.  -He is on a stable dose of steroids.  He  is advised to continue his current steroid replacement therapy including Hydrocortisone 15 mg p.o. daily in the morning, and 10 mg daily at noon. He is advised to continue Fludrocortisone 0.1mg  po daily.   -He is advised not to discontinue his steroid replacement without first discussing it with his providers.  6) Hypothyroidism -His previsit thyroid function tests are consistent with appropriate hormone replacement.  He is advised to continue Levothyroxine 225 mcg po daily before breakfast.       - We discussed about the correct intake of his thyroid hormone, on empty stomach at fasting, with water, separated by at least 30 minutes from breakfast and other medications,  and separated by more than 4 hours from calcium, iron, multivitamins, acid reflux medications (PPIs). -Patient is made aware of the fact that thyroid hormone replacement is needed for life, dose to be adjusted by periodic monitoring of thyroid function tests.  7) Vitamin D deficiency His most recent vitamin D level was 33.2 on 04/29/22.  He has taken OTC Vitamin D3 5000 units daily in the past but is not currently taking any.    8) Chronic Care/Health Maintenance: -he is not on ACEI/ARB, is on Statin medications and is encouraged to continue to follow up with Ophthalmology, Dentist, Podiatrist at least yearly or according to recommendations, and advised to Quit smoking. I have recommended yearly flu vaccine and pneumonia vaccination at least every 5 years; moderate intensity exercise for up to 150 minutes weekly; and  sleep for at least 7 hours a day.  The patient was counseled on the dangers of tobacco use, and was advised to quit.  Reviewed strategies to maximize success, including removing cigarettes and smoking materials from environment.  - I advised patient to maintain close follow up with Assunta Found, MD for primary care needs.      I spent 37 minutes in the care of the patient today including review of labs from CMP,  Lipids, Thyroid Function, Hematology (current and previous including abstractions from other facilities); face-to-face time discussing  his blood glucose readings/logs, discussing hypoglycemia and hyperglycemia episodes and symptoms, medications doses, his options of short and long term treatment based on the latest standards of care / guidelines;  discussion about incorporating lifestyle medicine;  and documenting the encounter. Risk reduction counseling performed per USPSTF guidelines to reduce obesity and cardiovascular risk factors.     Please refer to Patient Instructions for Blood Glucose Monitoring and Insulin/Medications Dosing Guide"  in media tab for additional information. Please  also refer to " Patient Self Inventory" in the Media  tab for reviewed elements of pertinent patient history.  Jeffery Daniels participated in the discussions, expressed understanding, and voiced agreement with the above plans.  All questions were answered to his satisfaction. he is encouraged to contact clinic should he have any questions or concerns prior to his return visit.   Follow up plan: - Return in about 4 months (around 09/07/2022) for Diabetes F/U with A1c in office, No previsit labs, Bring meter and logs.   Ronny Bacon, [facility] [address] [place], [place] [address] Phone: [phone] Fax: [phone]  05/09/2022, 4:42 PM

## 2022-09-07 ENCOUNTER — Ambulatory Visit: Payer: Commercial Managed Care - PPO | Admitting: Nurse Practitioner

## 2022-09-07 DIAGNOSIS — I10 Essential (primary) hypertension: Secondary | ICD-10-CM

## 2022-09-07 DIAGNOSIS — E782 Mixed hyperlipidemia: Secondary | ICD-10-CM

## 2022-09-07 DIAGNOSIS — E559 Vitamin D deficiency, unspecified: Secondary | ICD-10-CM

## 2022-09-07 DIAGNOSIS — E2740 Unspecified adrenocortical insufficiency: Secondary | ICD-10-CM

## 2022-09-07 DIAGNOSIS — E039 Hypothyroidism, unspecified: Secondary | ICD-10-CM

## 2022-09-07 DIAGNOSIS — E1165 Type 2 diabetes mellitus with hyperglycemia: Secondary | ICD-10-CM

## 2022-09-15 ENCOUNTER — Ambulatory Visit: Payer: Commercial Managed Care - PPO | Admitting: Nurse Practitioner

## 2022-09-15 DIAGNOSIS — E1165 Type 2 diabetes mellitus with hyperglycemia: Secondary | ICD-10-CM

## 2022-09-15 DIAGNOSIS — E782 Mixed hyperlipidemia: Secondary | ICD-10-CM

## 2022-09-15 DIAGNOSIS — I10 Essential (primary) hypertension: Secondary | ICD-10-CM

## 2022-09-15 DIAGNOSIS — E559 Vitamin D deficiency, unspecified: Secondary | ICD-10-CM

## 2022-09-15 DIAGNOSIS — E039 Hypothyroidism, unspecified: Secondary | ICD-10-CM

## 2022-09-15 DIAGNOSIS — Z7984 Long term (current) use of oral hypoglycemic drugs: Secondary | ICD-10-CM

## 2022-09-15 DIAGNOSIS — E2740 Unspecified adrenocortical insufficiency: Secondary | ICD-10-CM

## 2022-10-05 ENCOUNTER — Ambulatory Visit: Payer: Commercial Managed Care - PPO | Admitting: Nurse Practitioner

## 2022-10-05 ENCOUNTER — Encounter: Payer: Self-pay | Admitting: Nurse Practitioner

## 2022-10-05 VITALS — BP 133/83 | HR 86 | Ht 71.0 in | Wt 266.4 lb

## 2022-10-05 DIAGNOSIS — E782 Mixed hyperlipidemia: Secondary | ICD-10-CM

## 2022-10-05 DIAGNOSIS — E2740 Unspecified adrenocortical insufficiency: Secondary | ICD-10-CM | POA: Diagnosis not present

## 2022-10-05 DIAGNOSIS — E039 Hypothyroidism, unspecified: Secondary | ICD-10-CM

## 2022-10-05 DIAGNOSIS — I10 Essential (primary) hypertension: Secondary | ICD-10-CM

## 2022-10-05 DIAGNOSIS — Z7984 Long term (current) use of oral hypoglycemic drugs: Secondary | ICD-10-CM

## 2022-10-05 DIAGNOSIS — E1165 Type 2 diabetes mellitus with hyperglycemia: Secondary | ICD-10-CM

## 2022-10-05 DIAGNOSIS — E559 Vitamin D deficiency, unspecified: Secondary | ICD-10-CM | POA: Diagnosis not present

## 2022-10-05 LAB — POCT GLYCOSYLATED HEMOGLOBIN (HGB A1C): Hemoglobin A1C: 6.6 % — AB (ref 4.0–5.6)

## 2022-10-05 LAB — POCT UA - MICROALBUMIN
Albumin/Creatinine Ratio, Urine, POC: <30
Creatinine, POC: 300 mg/dL
Microalbumin Ur, POC: 10 mg/L

## 2022-10-05 NOTE — Progress Notes (Signed)
10/05/2022, 4:15 PM   Endocrinology follow-up note   Subjective:    Patient ID: Jeffery Daniels, male    DOB: 10-07-1965.  Jeffery Daniels is being seen in follow-up for management of currently uncontrolled type 2 diabetes, adrenal insufficiency, hypothyroidism, hyperlipidemia, hypertension.    PMD:   Assunta Found, MD.   Past Medical History:  Diagnosis Date   Addison disease [facility])    Hyperlipidemia    Past Surgical History:  Procedure Laterality Date   HERNIA REPAIR     Social History   Socioeconomic History   Marital status: Single    Spouse name: Not on file   Number of children: Not on file   Years of education: Not on file   Highest education level: Not on file  Occupational History   Not on file  Tobacco Use   Smoking status: Every Day    Packs/day: 1.50    Years: 25.00    Additional pack years: 0.00    Total pack years: 37.50    Types: Cigarettes   Smokeless tobacco: Never  Vaping Use   Vaping Use: Never used  Substance and Sexual Activity   Alcohol use: Yes    Comment: Occasional   Drug use: Never   Sexual activity: Never  Other Topics Concern   Not on file  Social History Narrative   Not on file   Social Determinants of Health   Financial Resource Strain: Not on file  Food Insecurity: Not on file  Transportation Needs: Not on file  Physical Activity: Not on file  Stress: Not on file  Social Connections: Not on file   Outpatient Encounter Medications as of 10/05/2022  Medication Sig   blood glucose meter kit and supplies Dispense based on patient and insurance preference. Check blood glucose 1 time daily before breakfast.   Cholecalciferol (VITAMIN D3) 125 MCG (5000 UT) CAPS Take 1 capsule (5,000 Units total) by mouth daily.   fenofibrate (TRICOR) 145 MG tablet Take 1 tablet (145 mg total) by mouth daily.   fludrocortisone (FLORINEF) 0.1 MG tablet Take 1 tablet (0.1 mg total) by mouth daily.   glipiZIDE (GLUCOTROL  XL) 5 MG 24 hr tablet Take 1 tablet (5 mg total) by mouth daily with breakfast.   glucose blood test strip Use as instructed   hydrochlorothiazide (HYDRODIURIL) 25 MG tablet Take 1 tablet (25 mg total) by mouth daily.   hydrocortisone (CORTEF) 10 MG tablet TAKE ONE AND ONE-HALF TABLET (15MG  TOTAL) EVERY MORNING AND ONE TABLET (10MG  TOTAL) EVERYDAY AT NOON   LANCETS ULTRA FINE MISC 1 each by Does not apply route 4 (four) times daily.   levothyroxine (SYNTHROID) 200 MCG tablet Take 1 tablet (200 mcg total) by mouth daily before breakfast.   levothyroxine (SYNTHROID) 25 MCG tablet TAKE ONE TABLET ( TOTAL) BY MOUTH DAILY BEFORE BREAKFAST   lisinopril (ZESTRIL) 5 MG tablet TAKE ONE TABLET (5MG  TOTAL) BY MOUTH DAILY   metFORMIN (GLUCOPHAGE) 1000 MG tablet TAKE ONE TABLET (1000MG ) BY MOUTH TWICE DAILY WITH A MEAL   simvastatin (ZOCOR) 20 MG tablet Take 1 tablet (20 mg total) by mouth daily at 6 PM.   No facility-administered encounter medications on file as of 10/05/2022.    ALLERGIES: Allergies  Allergen Reactions   Bee Venom     VACCINATION STATUS:  There is no immunization history on file for this patient.  Diabetes He presents for his follow-up diabetic visit. He has type 2  diabetes mellitus. Onset time: He diagnosed at age of 54 years. His disease course has been improving. There are no hypoglycemic associated symptoms. Pertinent negatives for hypoglycemia include no headaches, nervousness/anxiousness, seizures or tremors. Pertinent negatives for diabetes include no blurred vision, no chest pain, no fatigue, no foot ulcerations, no polydipsia, no polyuria and no weight loss. There are no hypoglycemic complications. Symptoms are stable. There are no diabetic complications. Risk factors for coronary artery disease include diabetes mellitus, dyslipidemia, male sex, obesity, hypertension, sedentary lifestyle, tobacco exposure and family history. Current diabetic treatment includes oral agent  (dual therapy). He is compliant with treatment most of the time. His weight is decreasing steadily. He is following a generally healthy diet. When asked about meal planning, he reported none. He has not had a previous visit with a dietitian. He rarely participates in exercise. (He presents today with no meter or logs to review (forgot them at home).  His POCT A1c today is 6.6%, improving from last visit of 7.6%.  He reports he has been traveling a lot for work and has limited dietary choices and knows he needs to do better.  He denies any hypoglycemia.) An ACE inhibitor/angiotensin II receptor blocker is not being taken. He does not see a podiatrist.Eye exam is current.  Hyperlipidemia This is a chronic problem. The current episode started more than 1 year ago. The problem is uncontrolled. Recent lipid tests were reviewed and are variable. Exacerbating diseases include diabetes, hypothyroidism and obesity. Factors aggravating his hyperlipidemia include smoking and thiazides. Pertinent negatives include no chest pain, myalgias or shortness of breath. Current antihyperlipidemic treatment includes statins. The current treatment provides mild improvement of lipids. Compliance problems include adherence to diet and adherence to exercise.  Risk factors for coronary artery disease include dyslipidemia, diabetes mellitus, family history, obesity, male sex, hypertension and a sedentary lifestyle.  Hypertension This is a chronic problem. The current episode started more than 1 year ago. The problem has been gradually improving since onset. The problem is uncontrolled. Pertinent negatives include no blurred vision, chest pain, headaches, palpitations or shortness of breath. Agents associated with hypertension include thyroid hormones and steroids. Risk factors for coronary artery disease include diabetes mellitus, dyslipidemia, family history, obesity, male gender, sedentary lifestyle and smoking/tobacco exposure. Past  treatments include diuretics. The current treatment provides mild improvement. There are no compliance problems.  Identifiable causes of hypertension include a hypertension causing med and a thyroid problem.  Thyroid Problem Presents for follow-up visit. Symptoms include weight gain. Patient reports no anxiety, cold intolerance, constipation, diarrhea, fatigue, heat intolerance, palpitations, tremors or weight loss. The symptoms have been stable. His past medical history is significant for diabetes and hyperlipidemia.    Review of systems  Constitutional: + decreasing body weight,  current Body mass index is 37.16 kg/m. , no fatigue, no subjective hyperthermia, no subjective hypothermia Eyes: no blurry vision, no xerophthalmia ENT: no sore throat, no nodules palpated in throat, no dysphagia/odynophagia, no hoarseness Cardiovascular: no chest pain, no shortness of breath, no palpitations, no leg swelling Respiratory: no cough, no shortness of breath Gastrointestinal: no nausea/vomiting/diarrhea Musculoskeletal: no muscle/joint aches Skin: no rashes, no hyperemia Neurological: no tremors, no numbness, no tingling, no dizziness Psychiatric: no depression, no anxiety   Objective:    BP 133/83 (BP Location: Left Arm, Patient Position: Sitting, Cuff Size: Large)   Pulse 86   Ht 5\' 11"  (1.803 m)   Wt 266 lb 6.4 oz (120.8 kg)   BMI 37.16 kg/m   Wt  Readings from Last 3 Encounters:  10/05/22 266 lb 6.4 oz (120.8 kg)  05/09/22 273 lb 9.6 oz (124.1 kg)  10/14/21 278 lb (126.1 kg)     BP Readings from Last 3 Encounters:  10/05/22 133/83  05/09/22 126/86  10/14/21 (!) 141/85     Physical Exam- Limited  Constitutional:  Body mass index is 37.16 kg/m. , not in acute distress, normal state of mind Eyes:  EOMI, no exophthalmos Neck: Supple Musculoskeletal: no gross deformities, strength intact in all four extremities, no gross restriction of joint movements Skin:  no rashes, no  hyperemia Neurological: no tremor with outstretched hands   Diabetic Foot Exam - Simple   Simple Foot Form Diabetic Foot exam was performed with the following findings: Yes 10/05/2022  4:08 PM  Visual Inspection No deformities, no ulcerations, no other skin breakdown bilaterally: Yes Sensation Testing Intact to touch and monofilament testing bilaterally: Yes Pulse Check Posterior Tibialis and Dorsalis pulse intact bilaterally: Yes Comments     Recent Results (from the past 2160 hour(s))  POCT UA - Microalbumin     Status: Normal   Collection Time: 10/05/22  4:03 PM  Result Value Ref Range   Microalbumin Ur, POC 10 mg/L mg/L   Creatinine, POC 300 mg/dL mg/dL   Albumin/Creatinine Ratio, Urine, POC <30 mg/G   HgB A1c     Status: Abnormal   Collection Time: 10/05/22  4:04 PM  Result Value Ref Range   Hemoglobin A1C 6.6 (A) 4.0 - 5.6 %   HbA1c POC (<> result, manual entry)     HbA1c, POC (prediabetic range)     HbA1c, POC (controlled diabetic range)     Lipid Panel     Component Value Date/Time   CHOL 152 04/29/2022 0941   TRIG 231 (H) 04/29/2022 0941   HDL 40 04/29/2022 0941   CHOLHDL 3.8 04/29/2022 0941   LDLCALC 74 04/29/2022 0941     Assessment & Plan:   1) Uncontrolled type 2 diabetes mellitus with hyperglycemia   - Jeffery Daniels has currently controlled asymptomatic type 2 DM since 57 years of age.  He presents today with no meter or logs to review (forgot them at home).  His POCT A1c today is 6.6%, improving from last visit of 7.6%.  He reports he has been traveling a lot for work and has limited dietary choices and knows he needs to do better.  He denies any hypoglycemia.  -his diabetes is complicated by adrenal insufficiency on hydrocortisone, hypothyroidism, obesity/sedentary life, chronic heavy smoking and Jeffery Daniels remains at a high risk for more acute and chronic complications which include CAD, CVA, CKD, retinopathy, and neuropathy. These are all  discussed in detail with the patient.  - Nutritional counseling repeated at each appointment due to patients tendency to fall back in to old habits.  - The patient admits there is a room for improvement in their diet and drink choices. -  Suggestion is made for the patient to avoid simple carbohydrates from their diet including Cakes, Sweet Desserts / Pastries, Ice Cream, Soda (diet and regular), Sweet Tea, Candies, Chips, Cookies, Sweet Pastries, Store Bought Juices, Alcohol in Excess of 1-2 drinks a day, Artificial Sweeteners, Coffee Creamer, and "Sugar-free" Products. This will help patient to have stable blood glucose profile and potentially avoid unintended weight gain.   - I encouraged the patient to switch to unprocessed or minimally processed complex starch and increased protein intake (animal or plant source), fruits, and vegetables.   -  Patient is advised to stick to a routine mealtimes to eat 3 meals a day and avoid unnecessary snacks (to snack only to correct hypoglycemia).  - I have approached him with the following individualized plan to manage diabetes and patient agrees:   Based on his stable glycemic profile, no changes will be made to his medication regimen today.    -He is advised to continue Metformin 1000 mg p.o. twice daily with meals and continue Glipizide 5 mg XL daily with breakfast.   -He is encouraged to continue monitoring blood glucose consistently, at least once daily, before breakfast, and call the clinic if he has readings less than 70 or greater than 200 for 3 tests in a row.  - he continues to smoke heavily, not a suitable candidate for incretin therapy.  If he continues to lose control of diabetes, he will be considered for basal insulin.   - Patient specific target  A1c;  LDL, HDL, Triglycerides, were discussed in detail.  2) BP/HTN:  His blood pressure is controlled to target.  He is advised to continue HCTZ 25 mg po daily and Lisinopril 5 mg po daily  (refills sent in today).  3) Lipids/HPL:  His most recent lipid panel from 04/29/22 shows controlled LDL of 74 and elevated but improved triglycerides of 231.   He is advised to continue Simvastatin 20 mg po daily at bedtime and Fenofibrate 145 mg po daily at bedtime.  Side effects and precautions discussed with him.    4)  Weight/Diet:  His Body mass index is 37.16 kg/m.-clearly complicating his diabetes care.  He has lost 11 lbs since last visit.  He is a candidate for modest weight loss.  CDE Consult has been  initiated , exercise, and detailed carbohydrates information provided.  5) Adrenal insufficiency (HCC) - The circumstances of his diagnosis 25 years ago are not available available to review. He reports Addison's Disease.  -He is on a stable dose of steroids.  He is advised to continue his current steroid replacement therapy including Hydrocortisone 15 mg p.o. daily in the morning, and 10 mg daily at noon. He is advised to continue Fludrocortisone 0.1mg  po daily.   -He is advised not to discontinue his steroid replacement without first discussing it with his providers.  6) Hypothyroidism -There are no TFTs to review.  He is advised to continue Levothyroxine 225 mcg po daily before breakfast.    Will recheck prior to next visit and adjust dose accordingly.   - We discussed about the correct intake of his thyroid hormone, on empty stomach at fasting, with water, separated by at least 30 minutes from breakfast and other medications,  and separated by more than 4 hours from calcium, iron, multivitamins, acid reflux medications (PPIs). -Patient is made aware of the fact that thyroid hormone replacement is needed for life, dose to be adjusted by periodic monitoring of thyroid function tests.  7) Vitamin D deficiency His most recent vitamin D level was 33.2 on 04/29/22.  He has taken OTC Vitamin D3 5000 units daily in the past but is not currently taking any.    8) Chronic Care/Health  Maintenance: -he is not on ACEI/ARB, is on Statin medications and is encouraged to continue to follow up with Ophthalmology, Dentist, Podiatrist at least yearly or according to recommendations, and advised to Quit smoking. I have recommended yearly flu vaccine and pneumonia vaccination at least every 5 years; moderate intensity exercise for up to 150 minutes weekly; and  sleep  for at least 7 hours a day.  The patient was counseled on the dangers of tobacco use, and was advised to quit.  Reviewed strategies to maximize success, including removing cigarettes and smoking materials from environment.  - I advised patient to maintain close follow up with Assunta Found, MD for primary care needs.      I spent  28  minutes in the care of the patient today including review of labs from CMP, Lipids, Thyroid Function, Hematology (current and previous including abstractions from other facilities); face-to-face time discussing  his blood glucose readings/logs, discussing hypoglycemia and hyperglycemia episodes and symptoms, medications doses, his options of short and long term treatment based on the latest standards of care / guidelines;  discussion about incorporating lifestyle medicine;  and documenting the encounter. Risk reduction counseling performed per USPSTF guidelines to reduce obesity and cardiovascular risk factors.     Please refer to Patient Instructions for Blood Glucose Monitoring and Insulin/Medications Dosing Guide"  in media tab for additional information. Please  also refer to " Patient Self Inventory" in the Media  tab for reviewed elements of pertinent patient history.  [name] participated in the discussions, expressed understanding, and voiced agreement with the above plans.  All questions were answered to his satisfaction. he is encouraged to contact clinic should he have any questions or concerns prior to his return visit.   Follow up plan: - Return in about 4 months (around  02/04/2023) for Diabetes F/U with A1c in office, Thyroid follow up, Previsit labs, Bring meter and logs.   Ronny Bacon, [facility] [address] [place], [place] [address] Phone: [phone] Fax: [phone]  10/05/2022, 4:15 PM

## 2022-10-05 NOTE — Patient Instructions (Signed)

## 2023-02-04 LAB — COMPREHENSIVE METABOLIC PANEL
ALT: 25 [IU]/L (ref 0–44)
AST: 23 [IU]/L (ref 0–40)
Albumin: 4.4 g/dL (ref 3.8–4.9)
Alkaline Phosphatase: 59 [IU]/L (ref 44–121)
BUN/Creatinine Ratio: 14 (ref 9–20)
BUN: 13 mg/dL (ref 6–24)
Bilirubin Total: 0.3 mg/dL (ref 0.0–1.2)
CO2: 24 mmol/L (ref 20–29)
Calcium: 9.2 mg/dL (ref 8.7–10.2)
Chloride: 104 mmol/L (ref 96–106)
Creatinine, Ser: 0.92 mg/dL (ref 0.76–1.27)
Globulin, Total: 2.1 g/dL (ref 1.5–4.5)
Glucose: 127 mg/dL — ABNORMAL HIGH (ref 70–99)
Potassium: 4.1 mmol/L (ref 3.5–5.2)
Sodium: 141 mmol/L (ref 134–144)
Total Protein: 6.5 g/dL (ref 6.0–8.5)
eGFR: 97 mL/min/{1.73_m2} (ref >59)

## 2023-02-04 LAB — T4, FREE: Free T4: 1.34 ng/dL (ref 0.82–1.77)

## 2023-02-04 LAB — TSH: TSH: 3.67 u[IU]/mL (ref 0.450–4.500)

## 2023-02-07 ENCOUNTER — Ambulatory Visit: Payer: Commercial Managed Care - PPO | Admitting: Nurse Practitioner

## 2023-02-07 DIAGNOSIS — E559 Vitamin D deficiency, unspecified: Secondary | ICD-10-CM

## 2023-02-07 DIAGNOSIS — I10 Essential (primary) hypertension: Secondary | ICD-10-CM

## 2023-02-07 DIAGNOSIS — E2740 Unspecified adrenocortical insufficiency: Secondary | ICD-10-CM

## 2023-02-07 DIAGNOSIS — E1165 Type 2 diabetes mellitus with hyperglycemia: Secondary | ICD-10-CM

## 2023-02-07 DIAGNOSIS — E782 Mixed hyperlipidemia: Secondary | ICD-10-CM

## 2023-02-07 DIAGNOSIS — E039 Hypothyroidism, unspecified: Secondary | ICD-10-CM

## 2023-02-07 DIAGNOSIS — Z7984 Long term (current) use of oral hypoglycemic drugs: Secondary | ICD-10-CM

## 2023-02-14 ENCOUNTER — Other Ambulatory Visit: Payer: Self-pay | Admitting: *Deleted

## 2023-02-14 ENCOUNTER — Ambulatory Visit: Payer: Commercial Managed Care - PPO | Admitting: Nurse Practitioner

## 2023-02-14 ENCOUNTER — Encounter: Payer: Self-pay | Admitting: Nurse Practitioner

## 2023-02-14 VITALS — BP 122/83 | HR 89 | Ht 71.0 in | Wt 264.6 lb

## 2023-02-14 DIAGNOSIS — I10 Essential (primary) hypertension: Secondary | ICD-10-CM

## 2023-02-14 DIAGNOSIS — E039 Hypothyroidism, unspecified: Secondary | ICD-10-CM | POA: Diagnosis not present

## 2023-02-14 DIAGNOSIS — E559 Vitamin D deficiency, unspecified: Secondary | ICD-10-CM | POA: Diagnosis not present

## 2023-02-14 DIAGNOSIS — E2740 Unspecified adrenocortical insufficiency: Secondary | ICD-10-CM

## 2023-02-14 DIAGNOSIS — Z7984 Long term (current) use of oral hypoglycemic drugs: Secondary | ICD-10-CM

## 2023-02-14 DIAGNOSIS — E1165 Type 2 diabetes mellitus with hyperglycemia: Secondary | ICD-10-CM | POA: Diagnosis not present

## 2023-02-14 DIAGNOSIS — E782 Mixed hyperlipidemia: Secondary | ICD-10-CM | POA: Diagnosis not present

## 2023-02-14 LAB — HEMOGLOBIN A1C: Hemoglobin A1C: 7.1

## 2023-02-14 MED ORDER — FLUDROCORTISONE ACETATE 0.1 MG PO TABS: 0.1000 mg | ORAL_TABLET | Freq: Every day | ORAL | 3 refills | Status: DC

## 2023-02-14 MED ORDER — SIMVASTATIN 20 MG PO TABS: 20.0000 mg | ORAL_TABLET | Freq: Every day | ORAL | 3 refills | Status: DC

## 2023-02-14 MED ORDER — GLUCOSE BLOOD VI STRP: ORAL_STRIP | 12 refills | Status: AC

## 2023-02-14 MED ORDER — FENOFIBRATE 145 MG PO TABS: 145.0000 mg | ORAL_TABLET | Freq: Every day | ORAL | 3 refills | Status: DC

## 2023-02-14 MED ORDER — HYDROCORTISONE 10 MG PO TABS: ORAL_TABLET | ORAL | 3 refills | Status: DC

## 2023-02-14 MED ORDER — LEVOTHYROXINE SODIUM 200 MCG PO TABS: 200.0000 ug | ORAL_TABLET | Freq: Every day | ORAL | 3 refills | Status: DC

## 2023-02-14 MED ORDER — HYDROCHLOROTHIAZIDE 25 MG PO TABS: 25.0000 mg | ORAL_TABLET | Freq: Every day | ORAL | 3 refills | Status: DC

## 2023-02-14 MED ORDER — GLIPIZIDE ER 5 MG PO TB24: 5.0000 mg | ORAL_TABLET | Freq: Every day | ORAL | 3 refills | Status: DC

## 2023-02-14 MED ORDER — LISINOPRIL 5 MG PO TABS: ORAL_TABLET | ORAL | 3 refills | Status: DC

## 2023-02-14 MED ORDER — METFORMIN HCL 1000 MG PO TABS: ORAL_TABLET | ORAL | 3 refills | Status: DC

## 2023-02-14 MED ORDER — LEVOTHYROXINE SODIUM 25 MCG PO TABS: ORAL_TABLET | ORAL | 3 refills | Status: DC

## 2023-02-14 MED ORDER — VITAMIN D3 125 MCG (5000 UT) PO CAPS: 5000.0000 [IU] | ORAL_CAPSULE | Freq: Every day | ORAL | 3 refills | Status: DC

## 2023-02-14 MED ORDER — LANCETS ULTRA FINE MISC: 1.0000 | Freq: Four times a day (QID) | 2 refills | Status: AC

## 2023-02-14 NOTE — Progress Notes (Signed)
02/14/2023, 10:48 AM   Endocrinology follow-up note   Subjective:    Patient ID: Jeffery Daniels, male    DOB: 08/12/1965.  Jeffery Daniels is being seen in follow-up for management of currently uncontrolled type 2 diabetes, adrenal insufficiency, hypothyroidism, hyperlipidemia, hypertension.    PMD:   Assunta Found, MD.   Past Medical History:  Diagnosis Date   Addison disease [facility])    Hyperlipidemia    Past Surgical History:  Procedure Laterality Date   HERNIA REPAIR     Social History   Socioeconomic History   Marital status: Single    Spouse name: Not on file   Number of children: Not on file   Years of education: Not on file   Highest education level: Not on file  Occupational History   Not on file  Tobacco Use   Smoking status: Every Day    Current packs/day: 1.50    Average packs/day: 1.5 packs/day for 25.0 years (37.5 ttl pk-yrs)    Types: Cigarettes   Smokeless tobacco: Never  Vaping Use   Vaping status: Never Used  Substance and Sexual Activity   Alcohol use: Yes    Comment: Occasional   Drug use: Never   Sexual activity: Never  Other Topics Concern   Not on file  Social History Narrative   Not on file   Social Determinants of Health   Financial Resource Strain: Not on file  Food Insecurity: Not on file  Transportation Needs: Not on file  Physical Activity: Not on file  Stress: Not on file  Social Connections: Not on file   Outpatient Encounter Medications as of 02/14/2023  Medication Sig   blood glucose meter kit and supplies Dispense based on patient and insurance preference. Check blood glucose 1 time daily before breakfast.   [DISCONTINUED] Cholecalciferol (VITAMIN D3) 125 MCG (5000 UT) CAPS Take 1 capsule (5,000 Units total) by mouth daily.   [DISCONTINUED] fenofibrate (TRICOR) 145 MG tablet Take 1 tablet (145 mg total) by mouth daily.   [DISCONTINUED] fludrocortisone (FLORINEF) 0.1 MG tablet Take 1 tablet (0.1  mg total) by mouth daily.   [DISCONTINUED] glipiZIDE (GLUCOTROL XL) 5 MG 24 hr tablet Take 1 tablet (5 mg total) by mouth daily with breakfast.   [DISCONTINUED] glucose blood test strip Use as instructed   [DISCONTINUED] hydrochlorothiazide (HYDRODIURIL) 25 MG tablet Take 1 tablet (25 mg total) by mouth daily.   [DISCONTINUED] hydrocortisone (CORTEF) 10 MG tablet TAKE ONE AND ONE-HALF TABLET (15MG  TOTAL) EVERY MORNING AND ONE TABLET (10MG  TOTAL) EVERYDAY AT NOON   [DISCONTINUED] LANCETS ULTRA FINE MISC 1 each by Does not apply route 4 (four) times daily.   [DISCONTINUED] levothyroxine (SYNTHROID) 200 MCG tablet Take 1 tablet (200 mcg total) by mouth daily before breakfast.   [DISCONTINUED] levothyroxine (SYNTHROID) 25 MCG tablet TAKE ONE TABLET ( TOTAL) BY MOUTH DAILY BEFORE BREAKFAST   [DISCONTINUED] lisinopril (ZESTRIL) 5 MG tablet TAKE ONE TABLET (5MG  TOTAL) BY MOUTH DAILY   [DISCONTINUED] metFORMIN (GLUCOPHAGE) 1000 MG tablet TAKE ONE TABLET (1000MG ) BY MOUTH TWICE DAILY WITH A MEAL   [DISCONTINUED] simvastatin (ZOCOR) 20 MG tablet Take 1 tablet (20 mg total) by mouth daily at 6 PM.   Cholecalciferol (VITAMIN D3) 125 MCG (5000 UT) CAPS Take 1 capsule (5,000 Units total) by mouth daily.   fenofibrate (TRICOR) 145 MG tablet Take 1 tablet (145 mg total) by mouth daily.   fludrocortisone (FLORINEF) 0.1 MG tablet Take 1 tablet (  0.1 mg total) by mouth daily.   glipiZIDE (GLUCOTROL XL) 5 MG 24 hr tablet Take 1 tablet (5 mg total) by mouth daily with breakfast.   glucose blood test strip Use as instructed   hydrochlorothiazide (HYDRODIURIL) 25 MG tablet Take 1 tablet (25 mg total) by mouth daily.   hydrocortisone (CORTEF) 10 MG tablet TAKE ONE AND ONE-HALF TABLET (15MG  TOTAL) EVERY MORNING AND ONE TABLET (10MG  TOTAL) EVERYDAY AT NOON   Lancets Ultra Fine MISC 1 each by Does not apply route 4 (four) times daily.   levothyroxine (SYNTHROID) 200 MCG tablet Take 1 tablet (200 mcg total) by mouth  daily before breakfast.   levothyroxine (SYNTHROID) 25 MCG tablet TAKE ONE TABLET ( TOTAL) BY MOUTH DAILY BEFORE BREAKFAST   lisinopril (ZESTRIL) 5 MG tablet TAKE ONE TABLET (5MG  TOTAL) BY MOUTH DAILY   metFORMIN (GLUCOPHAGE) 1000 MG tablet TAKE ONE TABLET (1000MG ) BY MOUTH TWICE DAILY WITH A MEAL   simvastatin (ZOCOR) 20 MG tablet Take 1 tablet (20 mg total) by mouth daily at 6 PM.   No facility-administered encounter medications on file as of 02/14/2023.    ALLERGIES: Allergies  Allergen Reactions   Bee Venom     VACCINATION STATUS:  There is no immunization history on file for this patient.  Diabetes He presents for his follow-up diabetic visit. He has type 2 diabetes mellitus. Onset time: He diagnosed at age of 25 years. His disease course has been stable. There are no hypoglycemic associated symptoms. Pertinent negatives for hypoglycemia include no headaches, nervousness/anxiousness, seizures or tremors. Pertinent negatives for diabetes include no blurred vision, no chest pain, no fatigue, no foot ulcerations, no polydipsia, no polyuria and no weight loss. There are no hypoglycemic complications. Symptoms are stable. There are no diabetic complications. Risk factors for coronary artery disease include diabetes mellitus, dyslipidemia, male sex, obesity, hypertension, sedentary lifestyle, tobacco exposure and family history. Current diabetic treatment includes oral agent (dual therapy). He is compliant with treatment most of the time. His weight is decreasing steadily. He is following a generally healthy diet. When asked about meal planning, he reported none. He has not had a previous visit with a dietitian. He rarely participates in exercise. His overall blood glucose range is 140-180 mg/dl. (He presents today with no meter or logs to review.  His POCT A1c today is 7.1%, increasing from last visit of 6.6%.  He reports he has been traveling a lot for work and has limited dietary choices  and knows he needs to do better.  He denies any hypoglycemia.  He notes he average glucose is ranging between 120-180 at the highest.) An ACE inhibitor/angiotensin II receptor blocker is not being taken. He does not see a podiatrist.Eye exam is current.  Hyperlipidemia This is a chronic problem. The current episode started more than 1 year ago. The problem is uncontrolled. Recent lipid tests were reviewed and are variable. Exacerbating diseases include diabetes, hypothyroidism and obesity. Factors aggravating his hyperlipidemia include smoking and thiazides. Pertinent negatives include no chest pain, myalgias or shortness of breath. Current antihyperlipidemic treatment includes statins. The current treatment provides mild improvement of lipids. Compliance problems include adherence to diet and adherence to exercise.  Risk factors for coronary artery disease include dyslipidemia, diabetes mellitus, family history, obesity, male sex, hypertension and a sedentary lifestyle.  Hypertension This is a chronic problem. The current episode started more than 1 year ago. The problem has been gradually improving since onset. The problem is uncontrolled. Pertinent negatives include no  blurred vision, chest pain, headaches, palpitations or shortness of breath. Agents associated with hypertension include thyroid hormones and steroids. Risk factors for coronary artery disease include diabetes mellitus, dyslipidemia, family history, obesity, male gender, sedentary lifestyle and smoking/tobacco exposure. Past treatments include diuretics. The current treatment provides mild improvement. There are no compliance problems.  Identifiable causes of hypertension include a hypertension causing med and a thyroid problem.  Thyroid Problem Presents for follow-up visit. Symptoms include weight gain. Patient reports no anxiety, cold intolerance, constipation, diarrhea, fatigue, heat intolerance, palpitations, tremors or weight loss. The  symptoms have been stable. His past medical history is significant for diabetes and hyperlipidemia.    Review of systems  Constitutional: + decreasing body weight,  current Body mass index is 36.9 kg/m. , no fatigue, no subjective hyperthermia, no subjective hypothermia Eyes: no blurry vision, no xerophthalmia ENT: no sore throat, no nodules palpated in throat, no dysphagia/odynophagia, no hoarseness Cardiovascular: no chest pain, no shortness of breath, no palpitations, no leg swelling Respiratory: no cough, no shortness of breath Gastrointestinal: no nausea/vomiting/diarrhea Musculoskeletal: no muscle/joint aches Skin: no rashes, no hyperemia Neurological: no tremors, no numbness, no tingling, no dizziness Psychiatric: no depression, no anxiety   Objective:    BP 122/83 (BP Location: Left Arm, Patient Position: Sitting, Cuff Size: Large)   Pulse 89   Ht 5\' 11"  (1.803 m)   Wt 264 lb 9.6 oz (120 kg)   BMI 36.90 kg/m   Wt Readings from Last 3 Encounters:  02/14/23 264 lb 9.6 oz (120 kg)  10/05/22 266 lb 6.4 oz (120.8 kg)  05/09/22 273 lb 9.6 oz (124.1 kg)     BP Readings from Last 3 Encounters:  02/14/23 122/83  10/05/22 133/83  05/09/22 126/86     Physical Exam- Limited  Constitutional:  Body mass index is 36.9 kg/m. , not in acute distress, normal state of mind Eyes:  EOMI, no exophthalmos Musculoskeletal: no gross deformities, strength intact in all four extremities, no gross restriction of joint movements Skin:  no rashes, no hyperemia Neurological: no tremor with outstretched hands   Diabetic Foot Exam - Simple   No data filed     Recent Results (from the past 2160 hour(s))  Comprehensive metabolic panel     Status: Abnormal   Collection Time: 02/03/23  8:40 AM  Result Value Ref Range   Glucose 127 (H) 70 - 99 mg/dL   BUN 13 6 - 24 mg/dL   Creatinine, Ser 4.09 0.76 - 1.27 mg/dL   eGFR 97 >81 XB/JYN/8.29   BUN/Creatinine Ratio 14 9 - 20   Sodium 141  134 - 144 mmol/L   Potassium 4.1 3.5 - 5.2 mmol/L   Chloride 104 96 - 106 mmol/L   CO2 24 20 - 29 mmol/L   Calcium 9.2 8.7 - 10.2 mg/dL   Total Protein 6.5 6.0 - 8.5 g/dL   Albumin 4.4 3.8 - 4.9 g/dL   Globulin, Total 2.1 1.5 - 4.5 g/dL   Bilirubin Total 0.3 0.0 - 1.2 mg/dL   Alkaline Phosphatase 59 44 - 121 IU/L   AST 23 0 - 40 IU/L   ALT 25 0 - 44 IU/L  TSH     Status: None   Collection Time: 02/03/23  8:40 AM  Result Value Ref Range   TSH 3.670 0.450 - 4.500 uIU/mL  T4, free     Status: None   Collection Time: 02/03/23  8:40 AM  Result Value Ref Range   Free T4 1.34  0.82 - 1.77 ng/dL  Hemoglobin I4P     Status: None   Collection Time: 02/14/23 12:00 AM  Result Value Ref Range   Hemoglobin A1C 7.1    Lipid Panel     Component Value Date/Time   CHOL 152 04/29/2022 0941   TRIG 231 (H) 04/29/2022 0941   HDL 40 04/29/2022 0941   CHOLHDL 3.8 04/29/2022 0941   LDLCALC 74 04/29/2022 0941     Assessment & Plan:   1) Uncontrolled type 2 diabetes mellitus with hyperglycemia   - Jeffery Daniels has currently controlled asymptomatic type 2 DM since 57 years of age.  He presents today with no meter or logs to review.  His POCT A1c today is 7.1%, increasing from last visit of 6.6%.  He reports he has been traveling a lot for work and has limited dietary choices and knows he needs to do better.  He denies any hypoglycemia.  He notes he average glucose is ranging between 120-180 at the highest.  -his diabetes is complicated by adrenal insufficiency on hydrocortisone, hypothyroidism, obesity/sedentary life, chronic heavy smoking and Jeffery Daniels remains at a high risk for more acute and chronic complications which include CAD, CVA, CKD, retinopathy, and neuropathy. These are all discussed in detail with the patient.  - Nutritional counseling repeated at each appointment due to patients tendency to fall back in to old habits.  - The patient admits there is a room for improvement in  their diet and drink choices. -  Suggestion is made for the patient to avoid simple carbohydrates from their diet including Cakes, Sweet Desserts / Pastries, Ice Cream, Soda (diet and regular), Sweet Tea, Candies, Chips, Cookies, Sweet Pastries, Store Bought Juices, Alcohol in Excess of 1-2 drinks a day, Artificial Sweeteners, Coffee Creamer, and "Sugar-free" Products. This will help patient to have stable blood glucose profile and potentially avoid unintended weight gain.   - I encouraged the patient to switch to unprocessed or minimally processed complex starch and increased protein intake (animal or plant source), fruits, and vegetables.   - Patient is advised to stick to a routine mealtimes to eat 3 meals a day and avoid unnecessary snacks (to snack only to correct hypoglycemia).  - I have approached him with the following individualized plan to manage diabetes and patient agrees:   Based on his stable glycemic profile, no changes will be made to his medication regimen today.    -He is advised to continue Metformin 1000 mg p.o. twice daily with meals and continue Glipizide 5 mg XL daily with breakfast.   -He is encouraged to continue monitoring blood glucose consistently, at least once daily, before breakfast, and call the clinic if he has readings less than 70 or greater than 200 for 3 tests in a row.  - he continues to smoke heavily, not a suitable candidate for incretin therapy.  If he continues to lose control of diabetes, he will be considered for basal insulin.   - Patient specific target  A1c;  LDL, HDL, Triglycerides, were discussed in detail.  2) BP/HTN:  His blood pressure is controlled to target.  He is advised to continue HCTZ 25 mg po daily and Lisinopril 5 mg po daily (refills sent in today).  3) Lipids/HPL:  His most recent lipid panel from 04/29/22 shows controlled LDL of 74 and elevated but improved triglycerides of 231.   He is advised to continue Simvastatin 20 mg po daily  at bedtime and Fenofibrate 145 mg po  daily at bedtime.  Side effects and precautions discussed with him.  Will recheck lipid panel prior to next visit.  4)  Weight/Diet:  His Body mass index is 36.9 kg/m.-clearly complicating his diabetes care.  He has lost 11 lbs since last visit.  He is a candidate for modest weight loss.  CDE Consult has been  initiated , exercise, and detailed carbohydrates information provided.  5) Adrenal insufficiency (HCC) - The circumstances of his diagnosis 25 years ago are not available available to review. He reports Addison's Disease.  -He is on a stable dose of steroids.  He is advised to continue his current steroid replacement therapy including Hydrocortisone 15 mg p.o. daily in the morning, and 10 mg daily at noon. He is advised to continue Fludrocortisone 0.1mg  po daily.   -He is advised not to discontinue his steroid replacement without first discussing it with his providers.  6) Hypothyroidism -His previsit TFTs are consistent with appropriate hormone replacement.  He is advised to continue Levothyroxine 225 mcg po daily before breakfast.    Will recheck prior to next visit and adjust dose accordingly.   - We discussed about the correct intake of his thyroid hormone, on empty stomach at fasting, with water, separated by at least 30 minutes from breakfast and other medications,  and separated by more than 4 hours from calcium, iron, multivitamins, acid reflux medications (PPIs). -Patient is made aware of the fact that thyroid hormone replacement is needed for life, dose to be adjusted by periodic monitoring of thyroid function tests.  7) Vitamin D deficiency His most recent vitamin D level was 33.2 on 04/29/22.  He has taken OTC Vitamin D3 5000 units daily in the past but is not currently taking any.  Will recheck vitamin d prior to visit.  8) Chronic Care/Health Maintenance: -he is not on ACEI/ARB, is on Statin medications and is encouraged to continue to  follow up with Ophthalmology, Dentist, Podiatrist at least yearly or according to recommendations, and advised to Quit smoking. I have recommended yearly flu vaccine and pneumonia vaccination at least every 5 years; moderate intensity exercise for up to 150 minutes weekly; and  sleep for at least 7 hours a day.  The patient was counseled on the dangers of tobacco use, and was advised to quit.  Reviewed strategies to maximize success, including removing cigarettes and smoking materials from environment.  - I advised patient to maintain close follow up with Assunta Found, MD for primary care needs.     I spent  30  minutes in the care of the patient today including review of labs from CMP, Lipids, Thyroid Function, Hematology (current and previous including abstractions from other facilities); face-to-face time discussing  his blood glucose readings/logs, discussing hypoglycemia and hyperglycemia episodes and symptoms, medications doses, his options of short and long term treatment based on the latest standards of care / guidelines;  discussion about incorporating lifestyle medicine;  and documenting the encounter. Risk reduction counseling performed per USPSTF guidelines to reduce obesity and cardiovascular risk factors.     Please refer to Patient Instructions for Blood Glucose Monitoring and Insulin/Medications Dosing Guide"  in media tab for additional information. Please  also refer to " Patient Self Inventory" in the Media  tab for reviewed elements of pertinent patient history.  Jeffery Daniels participated in the discussions, expressed understanding, and voiced agreement with the above plans.  All questions were answered to his satisfaction. he is encouraged to contact clinic should he have  any questions or concerns prior to his return visit.   Follow up plan: - Return in about 4 months (around 06/17/2023) for Diabetes F/U with A1c in office, Thyroid follow up, Previsit labs, Bring meter and  logs.   Jeffery Daniels, [facility] [address] [place], [place] [address] Phone: [phone] Fax: [phone]  02/14/2023, 10:48 AM

## 2023-02-14 NOTE — Patient Instructions (Signed)

## 2023-05-23 ENCOUNTER — Other Ambulatory Visit: Payer: Self-pay | Admitting: Nurse Practitioner

## 2023-06-20 ENCOUNTER — Ambulatory Visit: Payer: Commercial Managed Care - PPO | Admitting: Nurse Practitioner

## 2023-06-27 ENCOUNTER — Other Ambulatory Visit: Payer: Self-pay | Admitting: Nurse Practitioner

## 2023-07-12 ENCOUNTER — Telehealth: Payer: Self-pay | Admitting: *Deleted

## 2023-07-12 NOTE — Telephone Encounter (Signed)
 Patient left a message that he would like a call back to make appointment to replace one that he had to cancel previously.

## 2023-07-13 NOTE — Telephone Encounter (Signed)
 Called pt and lvm with new appt time and stated to call back if that did not work.  Let pt know to do labs 1 week before also.

## 2023-08-12 LAB — COMPREHENSIVE METABOLIC PANEL WITH GFR
ALT: 31 IU/L (ref 0–44)
AST: 32 IU/L (ref 0–40)
Albumin: 4.7 g/dL (ref 3.8–4.9)
Alkaline Phosphatase: 64 IU/L (ref 44–121)
BUN/Creatinine Ratio: 15 (ref 9–20)
BUN: 14 mg/dL (ref 6–24)
Bilirubin Total: 0.5 mg/dL (ref 0.0–1.2)
CO2: 22 mmol/L (ref 20–29)
Calcium: 9.5 mg/dL (ref 8.7–10.2)
Chloride: 99 mmol/L (ref 96–106)
Creatinine, Ser: 0.96 mg/dL (ref 0.76–1.27)
Globulin, Total: 2.4 g/dL (ref 1.5–4.5)
Glucose: 193 mg/dL — ABNORMAL HIGH (ref 70–99)
Potassium: 4.2 mmol/L (ref 3.5–5.2)
Sodium: 138 mmol/L (ref 134–144)
Total Protein: 7.1 g/dL (ref 6.0–8.5)
eGFR: 92 mL/min/{1.73_m2} (ref >59)

## 2023-08-12 LAB — LIPID PANEL
Chol/HDL Ratio: 4.7 ratio (ref 0.0–5.0)
Cholesterol, Total: 187 mg/dL (ref 100–199)
HDL: 40 mg/dL (ref >39)
LDL Chol Calc (NIH): 78 mg/dL (ref 0–99)
Triglycerides: 435 mg/dL — ABNORMAL HIGH (ref 0–149)
VLDL Cholesterol Cal: 69 mg/dL — ABNORMAL HIGH (ref 5–40)

## 2023-08-12 LAB — VITAMIN D 25 HYDROXY (VIT D DEFICIENCY, FRACTURES): Vit D, 25-Hydroxy: 42.6 ng/mL (ref 30.0–100.0)

## 2023-08-12 LAB — TSH: TSH: 10.5 u[IU]/mL — ABNORMAL HIGH (ref 0.450–4.500)

## 2023-08-12 LAB — T4, FREE: Free T4: 1.41 ng/dL (ref 0.82–1.77)

## 2023-08-15 ENCOUNTER — Telehealth: Payer: Self-pay | Admitting: *Deleted

## 2023-08-15 ENCOUNTER — Ambulatory Visit: Admitting: Nurse Practitioner

## 2023-08-15 DIAGNOSIS — E039 Hypothyroidism, unspecified: Secondary | ICD-10-CM

## 2023-08-15 DIAGNOSIS — E559 Vitamin D deficiency, unspecified: Secondary | ICD-10-CM

## 2023-08-15 DIAGNOSIS — I10 Essential (primary) hypertension: Secondary | ICD-10-CM

## 2023-08-15 DIAGNOSIS — E782 Mixed hyperlipidemia: Secondary | ICD-10-CM

## 2023-08-15 DIAGNOSIS — E1165 Type 2 diabetes mellitus with hyperglycemia: Secondary | ICD-10-CM

## 2023-08-15 DIAGNOSIS — Z7984 Long term (current) use of oral hypoglycemic drugs: Secondary | ICD-10-CM

## 2023-08-15 DIAGNOSIS — E2740 Unspecified adrenocortical insufficiency: Secondary | ICD-10-CM

## 2023-08-15 NOTE — Telephone Encounter (Signed)
 Patient left a voicemail . Stated that he had appointment this morning and would not be able to keep it. He would like a call back at [phone]

## 2023-08-15 NOTE — Telephone Encounter (Signed)
 Called pt no answer

## 2023-08-21 ENCOUNTER — Other Ambulatory Visit: Payer: Self-pay | Admitting: Nurse Practitioner

## 2023-08-25 ENCOUNTER — Encounter: Payer: Self-pay | Admitting: Nurse Practitioner

## 2023-08-25 ENCOUNTER — Ambulatory Visit: Admitting: Nurse Practitioner

## 2023-08-25 VITALS — BP 120/84 | HR 107 | Ht 71.0 in | Wt 267.0 lb

## 2023-08-25 DIAGNOSIS — E1165 Type 2 diabetes mellitus with hyperglycemia: Secondary | ICD-10-CM

## 2023-08-25 DIAGNOSIS — E782 Mixed hyperlipidemia: Secondary | ICD-10-CM | POA: Diagnosis not present

## 2023-08-25 DIAGNOSIS — E2740 Unspecified adrenocortical insufficiency: Secondary | ICD-10-CM

## 2023-08-25 DIAGNOSIS — E039 Hypothyroidism, unspecified: Secondary | ICD-10-CM | POA: Diagnosis not present

## 2023-08-25 DIAGNOSIS — Z7984 Long term (current) use of oral hypoglycemic drugs: Secondary | ICD-10-CM

## 2023-08-25 DIAGNOSIS — E559 Vitamin D deficiency, unspecified: Secondary | ICD-10-CM | POA: Diagnosis not present

## 2023-08-25 DIAGNOSIS — I10 Essential (primary) hypertension: Secondary | ICD-10-CM

## 2023-08-25 LAB — POCT GLYCOSYLATED HEMOGLOBIN (HGB A1C): Hemoglobin A1C: 8.4 % — AB (ref 4.0–5.6)

## 2023-08-25 MED ORDER — METFORMIN HCL 1000 MG PO TABS: 1000.0000 mg | ORAL_TABLET | Freq: Two times a day (BID) | ORAL | 3 refills | Status: DC

## 2023-08-25 NOTE — Progress Notes (Signed)
 08/25/2023, 11:17 AM   Endocrinology follow-up note   Subjective:    Patient ID: Jeffery Daniels, male    DOB: 1965/10/04.  Jeffery Daniels is being seen in follow-up for management of currently uncontrolled type 2 diabetes, adrenal insufficiency, hypothyroidism, hyperlipidemia, hypertension.    PMD:   Minus Amel, MD.   Past Medical History:  Diagnosis Date   Addison disease [facility])    Hyperlipidemia    Past Surgical History:  Procedure Laterality Date   HERNIA REPAIR     Social History   Socioeconomic History   Marital status: Single    Spouse name: Not on file   Number of children: Not on file   Years of education: Not on file   Highest education level: Not on file  Occupational History   Not on file  Tobacco Use   Smoking status: Every Day    Current packs/day: 1.50    Average packs/day: 1.5 packs/day for 25.0 years (37.5 ttl pk-yrs)    Types: Cigarettes   Smokeless tobacco: Never  Vaping Use   Vaping status: Never Used  Substance and Sexual Activity   Alcohol use: Yes    Comment: Occasional   Drug use: Never   Sexual activity: Never  Other Topics Concern   Not on file  Social History Narrative   Not on file   Social Drivers of Health   Financial Resource Strain: Not on file  Food Insecurity: Not on file  Transportation Needs: Not on file  Physical Activity: Not on file  Stress: Not on file  Social Connections: Not on file   Outpatient Encounter Medications as of 08/25/2023  Medication Sig   blood glucose meter kit and supplies Dispense based on patient and insurance preference. Check blood glucose 1 time daily before breakfast.   Cholecalciferol (VITAMIN D3) 125 MCG (5000 UT) CAPS Take 1 capsule (5,000 Units total) by mouth daily.   fenofibrate  (TRICOR ) 145 MG tablet Take 1 tablet (145 mg total) by mouth daily.   fludrocortisone  (FLORINEF ) 0.1 MG tablet Take 1 tablet (0.1 mg total) by mouth daily.   glipiZIDE  (GLUCOTROL  XL)  5 MG 24 hr tablet Take 1 tablet (5 mg total) by mouth daily with breakfast.   glucose blood test strip Use as instructed   hydrochlorothiazide  (HYDRODIURIL ) 25 MG tablet TAKE ONE TABLET (25MG  TOTAL) BY MOUTH DAILY   hydrocortisone  (CORTEF ) 10 MG tablet TAKE ONE AND ONE-HALF TABLET (15MG  TOTAL) EVERY MORNING AND ONE TABLET (10MG  TOTAL) EVERYDAY AT NOON   Lancets Ultra Fine MISC 1 each by Does not apply route 4 (four) times daily.   levothyroxine  (SYNTHROID ) 200 MCG tablet Take 1 tablet (200 mcg total) by mouth daily before breakfast.   levothyroxine  (SYNTHROID ) 25 MCG tablet TAKE ONE TABLET ( TOTAL) BY MOUTH DAILY BEFORE BREAKFAST   lisinopril  (ZESTRIL ) 5 MG tablet TAKE ONE TABLET (5MG  TOTAL) BY MOUTH DAILY   simvastatin  (ZOCOR ) 20 MG tablet Take 1 tablet (20 mg total) by mouth daily at 6 PM.   [DISCONTINUED] metFORMIN  (GLUCOPHAGE ) 1000 MG tablet TAKE ONE TABLET (1000MG ) BY MOUTH TWICE DAILY WITH A MEAL   metFORMIN  (GLUCOPHAGE ) 1000 MG tablet Take 1 tablet (1,000 mg total) by mouth 2 (two) times daily with a meal. TAKE ONE TABLET (1000MG ) BY MOUTH TWICE DAILY WITH A MEAL   No facility-administered encounter medications on file as of 08/25/2023.    ALLERGIES: Allergies  Allergen Reactions   Bee Venom  VACCINATION STATUS:  There is no immunization history on file for this patient.  Diabetes He presents for his follow-up diabetic visit. He has type 2 diabetes mellitus. Onset time: He diagnosed at age of 49 years. His disease course has been worsening. There are no hypoglycemic associated symptoms. Pertinent negatives for hypoglycemia include no headaches, nervousness/anxiousness, seizures or tremors. Pertinent negatives for diabetes include no blurred vision, no chest pain, no fatigue, no foot ulcerations, no polydipsia, no polyuria and no weight loss. There are no hypoglycemic complications. Symptoms are stable. There are no diabetic complications. Risk factors for coronary artery  disease include diabetes mellitus, dyslipidemia, male sex, obesity, hypertension, sedentary lifestyle, tobacco exposure and family history. Current diabetic treatment includes oral agent (dual therapy). He is compliant with treatment most of the time. His weight is fluctuating minimally. He is following a generally healthy diet. When asked about meal planning, he reported none. He has not had a previous visit with a dietitian. He rarely participates in exercise. His home blood glucose trend is fluctuating minimally. His overall blood glucose range is 180-200 mg/dl. (He presents today with no meter or logs to review.  His POCT A1c today is 8.4%, increasing from last visit of 7.1%.  He reports he has been traveling a lot for work and has limited dietary choices and knows he needs to do better.  He denies any hypoglycemia.  He notes he average glucose is ranging between 180-200.) An ACE inhibitor/angiotensin II receptor blocker is not being taken. He does not see a podiatrist.Eye exam is current.  Hyperlipidemia This is a chronic problem. The current episode started more than 1 year ago. The problem is uncontrolled. Recent lipid tests were reviewed and are variable. Exacerbating diseases include diabetes, hypothyroidism and obesity. Factors aggravating his hyperlipidemia include smoking and thiazides. Pertinent negatives include no chest pain, myalgias or shortness of breath. Current antihyperlipidemic treatment includes statins. The current treatment provides mild improvement of lipids. Compliance problems include adherence to diet and adherence to exercise.  Risk factors for coronary artery disease include dyslipidemia, diabetes mellitus, family history, obesity, male sex, hypertension and a sedentary lifestyle.  Hypertension This is a chronic problem. The current episode started more than 1 year ago. The problem has been gradually improving since onset. The problem is uncontrolled. Pertinent negatives include no  blurred vision, chest pain, headaches, palpitations or shortness of breath. Agents associated with hypertension include thyroid  hormones and steroids. Risk factors for coronary artery disease include diabetes mellitus, dyslipidemia, family history, obesity, male gender, sedentary lifestyle and smoking/tobacco exposure. Past treatments include diuretics. The current treatment provides mild improvement. There are no compliance problems.  Identifiable causes of hypertension include a hypertension causing med and a thyroid  problem.  Thyroid  Problem Presents for follow-up visit. Symptoms include weight gain. Patient reports no anxiety, cold intolerance, constipation, diarrhea, fatigue, heat intolerance, palpitations, tremors or weight loss. The symptoms have been stable. His past medical history is significant for diabetes.    Review of systems  Constitutional: + minimally fluctuating body weight,  current Body mass index is 37.24 kg/m. , no fatigue, no subjective hyperthermia, no subjective hypothermia Eyes: no blurry vision, no xerophthalmia ENT: no sore throat, no nodules palpated in throat, no dysphagia/odynophagia, no hoarseness Cardiovascular: no chest pain, no shortness of breath, no palpitations, no leg swelling Respiratory: no cough, no shortness of breath Gastrointestinal: no nausea/vomiting/diarrhea Musculoskeletal: no muscle/joint aches Skin: no rashes, no hyperemia Neurological: no tremors, no numbness, no tingling, no dizziness Psychiatric: no depression, no  anxiety   Objective:    BP 120/84 (BP Location: Right Arm, Patient Position: Sitting, Cuff Size: Large)   Pulse (!) 107   Ht 5\' 11"  (1.803 m)   Wt 267 lb (121.1 kg)   BMI 37.24 kg/m   Wt Readings from Last 3 Encounters:  08/25/23 267 lb (121.1 kg)  02/14/23 264 lb 9.6 oz (120 kg)  10/05/22 266 lb 6.4 oz (120.8 kg)     BP Readings from Last 3 Encounters:  08/25/23 120/84  02/14/23 122/83  10/05/22 133/83      Physical Exam- Limited  Constitutional:  Body mass index is 37.24 kg/m. , not in acute distress, normal state of mind Eyes:  EOMI, no exophthalmos Musculoskeletal: no gross deformities, strength intact in all four extremities, no gross restriction of joint movements Skin:  no rashes, no hyperemia Neurological: no tremor with outstretched hands   Diabetic Foot Exam - Simple   No data filed     Recent Results (from the past 2160 hours)  Comprehensive metabolic panel     Status: Abnormal   Collection Time: 08/11/23  9:04 AM  Result Value Ref Range   Glucose 193 (H) 70 - 99 mg/dL   BUN 14 6 - 24 mg/dL   Creatinine, Ser 1.61 0.76 - 1.27 mg/dL   eGFR 92 >09 UE/AVW/0.98   BUN/Creatinine Ratio 15 9 - 20   Sodium 138 134 - 144 mmol/L   Potassium 4.2 3.5 - 5.2 mmol/L   Chloride 99 96 - 106 mmol/L   CO2 22 20 - 29 mmol/L   Calcium 9.5 8.7 - 10.2 mg/dL   Total Protein 7.1 6.0 - 8.5 g/dL   Albumin 4.7 3.8 - 4.9 g/dL   Globulin, Total 2.4 1.5 - 4.5 g/dL   Bilirubin Total 0.5 0.0 - 1.2 mg/dL   Alkaline Phosphatase 64 44 - 121 IU/L   AST 32 0 - 40 IU/L   ALT 31 0 - 44 IU/L  Lipid panel     Status: Abnormal   Collection Time: 08/11/23  9:04 AM  Result Value Ref Range   Cholesterol, Total 187 100 - 199 mg/dL   Triglycerides 119 (H) 0 - 149 mg/dL   HDL 40 >14 mg/dL   VLDL Cholesterol Cal 69 (H) 5 - 40 mg/dL   LDL Chol Calc (NIH) 78 0 - 99 mg/dL   Chol/HDL Ratio 4.7 0.0 - 5.0 ratio    Comment:                                   T. Chol/HDL Ratio                                             Men  Women                               1/2 Avg.Risk  3.4    3.3                                   Avg.Risk  5.0    4.4  2X Avg.Risk  9.6    7.1                                3X Avg.Risk 23.4   11.0   TSH     Status: Abnormal   Collection Time: 08/11/23  9:04 AM  Result Value Ref Range   TSH 10.500 (H) 0.450 - 4.500 uIU/mL  T4, free     Status: None    Collection Time: 08/11/23  9:04 AM  Result Value Ref Range   Free T4 1.41 0.82 - 1.77 ng/dL  VITAMIN D  25 Hydroxy (Vit-D Deficiency, Fractures)     Status: None   Collection Time: 08/11/23  9:04 AM  Result Value Ref Range   Vit D, 25-Hydroxy 42.6 30.0 - 100.0 ng/mL    Comment: Vitamin D  deficiency has been defined by the Institute of Medicine and an Endocrine Society practice guideline as a level of serum 25-OH vitamin D  less than 20 ng/mL (1,2). The Endocrine Society went on to further define vitamin D  insufficiency as a level between 21 and 29 ng/mL (2). 1. IOM (Institute of Medicine). 2010. Dietary reference    intakes for calcium and D. [place]: The    [employer]. 2. Holick MF, Binkley [place], Bischoff-Ferrari HA, et al.    Evaluation, treatment, and prevention of vitamin D     deficiency: an Endocrine Society clinical practice    guideline. JCEM. 2011 Jul; 96(7):1911-30.   HgB A1c     Status: Abnormal   Collection Time: 08/25/23 11:08 AM  Result Value Ref Range   Hemoglobin A1C 8.4 (A) 4.0 - 5.6 %   HbA1c POC (<> result, manual entry)     HbA1c, POC (prediabetic range)     HbA1c, POC (controlled diabetic range)     Lipid Panel     Component Value Date/Time   CHOL 187 08/11/2023 0904   TRIG 435 (H) 08/11/2023 0904   HDL 40 08/11/2023 0904   CHOLHDL 4.7 08/11/2023 0904   LDLCALC 78 08/11/2023 0904     Assessment & Plan:   1) Uncontrolled type 2 diabetes mellitus with hyperglycemia   - Jeffery Daniels has currently controlled asymptomatic type 2 DM since 58 years of age.  He presents today with no meter or logs to review.  His POCT A1c today is 8.4%, increasing from last visit of 7.1%.  He reports he has been traveling a lot for work and has limited dietary choices and knows he needs to do better.  He denies any hypoglycemia.  He notes he average glucose is ranging between 180-200.  -his diabetes is complicated by adrenal insufficiency on  hydrocortisone , hypothyroidism, obesity/sedentary life, chronic heavy smoking and Jeffery Daniels remains at a high risk for more acute and chronic complications which include CAD, CVA, CKD, retinopathy, and neuropathy. These are all discussed in detail with the patient.  - Nutritional counseling repeated at each appointment due to patients tendency to fall back in to old habits.  - The patient admits there is a room for improvement in their diet and drink choices. -  Suggestion is made for the patient to avoid simple carbohydrates from their diet including Cakes, Sweet Desserts / Pastries, Ice Cream, Soda (diet and regular), Sweet Tea, Candies, Chips, Cookies, Sweet Pastries, Store Bought Juices, Alcohol in Excess of 1-2 drinks a day, Artificial Sweeteners, Coffee Creamer, and "Sugar-free" Products. This will help patient to  have stable blood glucose profile and potentially avoid unintended weight gain.   - I encouraged the patient to switch to unprocessed or minimally processed complex starch and increased protein intake (animal or plant source), fruits, and vegetables.   - Patient is advised to stick to a routine mealtimes to eat 3 meals a day and avoid unnecessary snacks (to snack only to correct hypoglycemia).  - I have approached him with the following individualized plan to manage diabetes and patient agrees:    -He is advised to continue Metformin  1000 mg p.o. twice daily with meals and continue Glipizide  5 mg XL daily with breakfast.  We did talk about adding on another agent if he cannot regain control with his current regimen.  He will benefit most from diet modification, is eating lots of fast food which is detrimental to his cardiovascular system as well.  -He is encouraged to continue monitoring blood glucose consistently, at least once daily, before breakfast, and call the clinic if he has readings less than 70 or greater than 200 for 3 tests in a row.  - he continues to smoke  heavily, not a suitable candidate for incretin therapy.  If he continues to lose control of diabetes, he will be considered for basal insulin.   - Patient specific target  A1c;  LDL, HDL, Triglycerides, were discussed in detail.  2) BP/HTN:  His blood pressure is controlled to target.  He is advised to continue HCTZ 25 mg po daily and Lisinopril  5 mg po daily (refills sent in today).  3) Lipids/HPL:  His most recent lipid panel from 08/11/23 shows controlled LDL of 78 and elevated triglycerides of 435.   He is advised to continue Simvastatin  20 mg po daily at bedtime and Fenofibrate  145 mg po daily at bedtime.  Side effects and precautions discussed with him.    4)  Weight/Diet:  His Body mass index is 37.24 kg/m.-clearly complicating his diabetes care.  He has lost 11 lbs since last visit.  He is a candidate for modest weight loss.  CDE Consult has been  initiated , exercise, and detailed carbohydrates information provided.  5) Adrenal insufficiency (HCC) - The circumstances of his diagnosis 25 years ago are not available available to review. He reports Addison's Disease.  -He is on a stable dose of steroids.  He is advised to continue his current steroid replacement therapy including Hydrocortisone  15 mg p.o. daily in the morning, and 10 mg daily at noon. He is advised to continue Fludrocortisone  0.1mg  po daily.   -He is advised not to discontinue his steroid replacement without first discussing it with his providers.  6) Hypothyroidism -His previsit TFTs are consistent with appropriate hormone replacement (TSH elevated but Free T4 normal- admits to missing several doses).  He is advised to continue Levothyroxine  225 mcg po daily before breakfast.       - We discussed about the correct intake of his thyroid  hormone, on empty stomach at fasting, with water, separated by at least 30 minutes from breakfast and other medications,  and separated by more than 4 hours from calcium, iron,  multivitamins, acid reflux medications (PPIs). -Patient is made aware of the fact that thyroid  hormone replacement is needed for life, dose to be adjusted by periodic monitoring of thyroid  function tests.  7) Vitamin D  deficiency His most recent vitamin D  level was 33.2 on 04/29/22.  He has taken OTC Vitamin D3 5000 units daily in the past but is not currently taking any.  Will recheck vitamin d  prior to visit.  8) Chronic Care/Health Maintenance: -he is not on ACEI/ARB, is on Statin medications and is encouraged to continue to follow up with Ophthalmology, Dentist, Podiatrist at least yearly or according to recommendations, and advised to Quit smoking. I have recommended yearly flu vaccine and pneumonia vaccination at least every 5 years; moderate intensity exercise for up to 150 minutes weekly; and  sleep for at least 7 hours a day.  The patient was counseled on the dangers of tobacco use, and was advised to quit.  Reviewed strategies to maximize success, including removing cigarettes and smoking materials from environment.  - I advised patient to maintain close follow up with Minus Amel, MD for primary care needs.     I spent  25  minutes in the care of the patient today including review of labs from CMP, Lipids, Thyroid  Function, Hematology (current and previous including abstractions from other facilities); face-to-face time discussing  his blood glucose readings/logs, discussing hypoglycemia and hyperglycemia episodes and symptoms, medications doses, his options of short and long term treatment based on the latest standards of care / guidelines;  discussion about incorporating lifestyle medicine;  and documenting the encounter. Risk reduction counseling performed per USPSTF guidelines to reduce obesity and cardiovascular risk factors.     Please refer to Patient Instructions for Blood Glucose Monitoring and Insulin/Medications Dosing Guide"  in media tab for additional information. Please   also refer to " Patient Self Inventory" in the Media  tab for reviewed elements of pertinent patient history.  Jeffery Daniels participated in the discussions, expressed understanding, and voiced agreement with the above plans.  All questions were answered to his satisfaction. he is encouraged to contact clinic should he have any questions or concerns prior to his return visit.   Follow up plan: - Return in about 3 months (around 11/25/2023) for Diabetes F/U with A1c in office, No previsit labs, Bring meter and logs.   Hulon Magic, [facility] [address] [place], [place] [address] Phone: [phone] Fax: [phone]  08/25/2023, 11:17 AM

## 2023-10-29 ENCOUNTER — Emergency Department (HOSPITAL_COMMUNITY)
Admission: EM | Admit: 2023-10-29 | Discharge: 2023-10-29 | Disposition: A | Discharge status: Home / Self Care | Attending: Emergency Medicine | Admitting: Emergency Medicine

## 2023-10-29 ENCOUNTER — Other Ambulatory Visit: Payer: Self-pay

## 2023-10-29 ENCOUNTER — Encounter (HOSPITAL_COMMUNITY): Payer: Self-pay

## 2023-10-29 DIAGNOSIS — N50811 Right testicular pain: Secondary | ICD-10-CM | POA: Diagnosis present

## 2023-10-29 DIAGNOSIS — N452 Orchitis: Secondary | ICD-10-CM | POA: Insufficient documentation

## 2023-10-29 DIAGNOSIS — N492 Inflammatory disorders of scrotum: Secondary | ICD-10-CM

## 2023-10-29 MED ORDER — LIDOCAINE-EPINEPHRINE (PF) 2 %-1:200000 IJ SOLN: 20.0000 mL | Freq: Once | INTRAMUSCULAR | Status: DC

## 2023-10-29 MED ORDER — DOXYCYCLINE HYCLATE 100 MG PO CAPS: 100.0000 mg | ORAL_CAPSULE | Freq: Two times a day (BID) | ORAL | 0 refills | Status: DC

## 2023-10-29 MED ORDER — AMOXICILLIN-POT CLAVULANATE 875-125 MG PO TABS: 1.0000 | ORAL_TABLET | Freq: Two times a day (BID) | ORAL | 0 refills | Status: AC

## 2023-10-29 MED ORDER — DOXYCYCLINE HYCLATE 100 MG PO TABS
100.0000 mg | ORAL_TABLET | Freq: Once | ORAL | Status: AC
  Administered 2023-10-29: 100 mg via ORAL
  Filled 2023-10-29: qty 1

## 2023-10-29 MED ORDER — LIDOCAINE HCL (PF) 2 % IJ SOLN
INTRAMUSCULAR | Status: AC
  Filled 2023-10-29: qty 5

## 2023-10-29 MED ORDER — AMOXICILLIN-POT CLAVULANATE 875-125 MG PO TABS
1.0000 | ORAL_TABLET | Freq: Once | ORAL | Status: AC
  Administered 2023-10-29: 1 via ORAL
  Filled 2023-10-29: qty 1

## 2023-10-29 NOTE — Discharge Instructions (Signed)
 Please follow-up closely with your primary care doctor or in the emergency department within the next 48 hours for reevaluation.  Please leave the packing in place.  Please return to emergency department immediately for any new or worsening symptoms to include fever, worsening pain, worsening swelling or changes with urination.  Please take all antibiotics as directed.

## 2023-10-29 NOTE — ED Provider Notes (Signed)
 [place] EMERGENCY DEPARTMENT AT [facility] Provider Note   CSN: [id number] Arrival date & time: 10/29/23  1058     Patient presents with: Testicle Pain   Jeffery Daniels is a 58 y.o. male.   Patient is a 58 year old male who presents emergency department with a chief complaint of pain just inferior to the right testicle.  He notes that pain has been ongoing for approximate the past 3 days.  He has admitted to an area of swelling.  He denies any history of similar symptoms in the past.  He denies any changes in urination to include dysuria or hematuria.  He denies any abdominal pain or flank pain.  He has had no associated fever or chills.   Testicle Pain       Prior to Admission medications   Medication Sig Start Date End Date Taking? Authorizing Provider  blood glucose meter kit and supplies Dispense based on patient and insurance preference. Check blood glucose 1 time daily before breakfast. 05/05/21   Therisa Benton PARAS, NP  Cholecalciferol (VITAMIN D3) 125 MCG (5000 UT) CAPS Take 1 capsule (5,000 Units total) by mouth daily. 02/14/23   Therisa Benton PARAS, NP  fenofibrate  (TRICOR ) 145 MG tablet Take 1 tablet (145 mg total) by mouth daily. 02/14/23   Therisa Benton PARAS, NP  fludrocortisone  (FLORINEF ) 0.1 MG tablet Take 1 tablet (0.1 mg total) by mouth daily. 02/14/23   Therisa Benton PARAS, NP  glipiZIDE  (GLUCOTROL  XL) 5 MG 24 hr tablet Take 1 tablet (5 mg total) by mouth daily with breakfast. 02/14/23   Therisa Benton PARAS, NP  glucose blood test strip Use as instructed 02/14/23   Therisa Benton PARAS, NP  hydrochlorothiazide  (HYDRODIURIL ) 25 MG tablet TAKE ONE TABLET (25MG  TOTAL) BY MOUTH DAILY 06/28/23   Therisa Benton PARAS, NP  hydrocortisone  (CORTEF ) 10 MG tablet TAKE ONE AND ONE-HALF TABLET (15MG  TOTAL) EVERY MORNING AND ONE TABLET (10MG  TOTAL) EVERYDAY AT NOON 02/14/23   Therisa Benton PARAS, NP  Lancets Ultra Fine MISC 1 each by Does not apply route 4 (four) times daily.  02/14/23   Therisa Benton PARAS, NP  levothyroxine  (SYNTHROID ) 200 MCG tablet Take 1 tablet (200 mcg total) by mouth daily before breakfast. 02/14/23   Therisa Benton PARAS, NP  levothyroxine  (SYNTHROID ) 25 MCG tablet TAKE ONE TABLET ( TOTAL) BY MOUTH DAILY BEFORE BREAKFAST 06/28/23   Therisa Benton PARAS, NP  lisinopril  (ZESTRIL ) 5 MG tablet TAKE ONE TABLET (5MG  TOTAL) BY MOUTH DAILY 06/28/23   Therisa Benton PARAS, NP  metFORMIN  (GLUCOPHAGE ) 1000 MG tablet Take 1 tablet (1,000 mg total) by mouth 2 (two) times daily with a meal. TAKE ONE TABLET (1000MG ) BY MOUTH TWICE DAILY WITH A MEAL 08/25/23   Therisa Benton PARAS, NP  simvastatin  (ZOCOR ) 20 MG tablet Take 1 tablet (20 mg total) by mouth daily at 6 PM. 02/14/23   Therisa Benton PARAS, NP    Allergies: Bee venom    Review of Systems  Genitourinary:  Positive for testicular pain.  All other systems reviewed and are negative.   Updated Vital Signs BP 135/87 (BP Location: Right Arm)   Pulse 82   Temp 98.1 F (36.7 C) (Oral)   Resp 16   Ht 5' 11 (1.803 m)   Wt 117.9 kg   SpO2 97%   BMI 36.26 kg/m   Physical Exam Vitals and nursing note reviewed.  Constitutional:      Appearance: Normal appearance.  HENT:     Head:  Normocephalic and atraumatic.  Eyes:     Extraocular Movements: Extraocular movements intact.     Conjunctiva/sclera: Conjunctivae normal.     Pupils: Pupils are equal, round, and reactive to light.  Cardiovascular:     Rate and Rhythm: Normal rate and regular rhythm.     Pulses: Normal pulses.     Heart sounds: Normal heart sounds.  Pulmonary:     Effort: Pulmonary effort is normal. No respiratory distress.  Abdominal:     General: Abdomen is flat. Bowel sounds are normal. There is no distension.     Palpations: Abdomen is soft.     Tenderness: There is no abdominal tenderness. There is no guarding.  Genitourinary:    Comments: Exam performed with nurse chaperone Brandi Abscess noted just inferior to the right testicle,  no involvement of the perineum, no induration noted over the perineum, no active discharge, testicles nontender to palpation, no changes to the penis Musculoskeletal:        General: Normal range of motion.  Skin:    General: Skin is warm and dry.  Neurological:     General: No focal deficit present.     Mental Status: He is alert and oriented to person, place, and time. Mental status is at baseline.  Psychiatric:        Mood and Affect: Mood normal.        Behavior: Behavior normal.        Thought Content: Thought content normal.        Judgment: Judgment normal.     (all labs ordered are listed, but only abnormal results are displayed) Labs Reviewed - No data to display  EKG: None  Radiology: No results found.   .Incision and Drainage  Date/Time: 10/29/2023 11:57 AM  Performed by: Daralene Lonni BIRCH, PA-C Authorized by: Daralene Lonni BIRCH, PA-C   Consent:    Consent obtained:  Verbal   Consent given by:  Patient   Risks discussed:  Bleeding, incomplete drainage and pain   Alternatives discussed:  No treatment, delayed treatment and alternative treatment Universal protocol:    Procedure explained and questions answered to patient or proxy's satisfaction: yes     Site/side marked: yes     Immediately prior to procedure, a time out was called: yes     Patient identity confirmed:  Verbally with patient, arm band, provided demographic data and hospital-assigned identification number Location:    Type:  Abscess   Size:  Moderate   Location:  Anogenital   Anogenital location:  Scrotal space Pre-procedure details:    Skin preparation:  Povidone-iodine Sedation:    Sedation type:  None Anesthesia:    Anesthesia method:  Local infiltration   Local anesthetic:  Lidocaine  1% WITH epi Procedure type:    Complexity:  Complex Procedure details:    Ultrasound guidance: yes     Needle aspiration: no     Incision types:  Single straight   Incision depth:  Dermal    Wound management:  Probed and deloculated and extensive cleaning   Drainage:  Purulent   Drainage amount:  Copious   Packing materials:  1/4 in iodoform gauze Post-procedure details:    Procedure completion:  Tolerated well, no immediate complications    Medications Ordered in the ED  lidocaine -EPINEPHrine  (XYLOCAINE  W/EPI) 2 %-1:200000 (PF) injection 20 mL (has no administration in time range)  lidocaine  HCl (PF) (XYLOCAINE ) 2 % injection (has no administration in time range)  amoxicillin -clavulanate (AUGMENTIN ) 875-125 MG per tablet  1 tablet (has no administration in time range)  doxycycline  (VIBRA -TABS) tablet 100 mg (has no administration in time range)                                    Medical Decision Making Patient is doing well at this time and is stable for discharge home.  Abscess to the right side of the scrotum was drained at the bedside with a copious amount of purulent discharge.  Area was packed and patient was directed to return to the emergency department in 2 days for reevaluation.  Vital signs are stable with no indication for sepsis at this point.  There is no indication for peritoneal involvement and he has no signs of Fournier's gangrene.  Do not suspect that any further advanced imaging is warranted at this point.  Care was taken during the procedure to avoid disruption of any deep structures.  Will place patient on antibiotics.  Continue good wound care on outpatient basis was discussed.  Patient was directed to leave the packing in place until evaluated again.  Strict turn precautions were discussed for any new or worsening symptoms.  Patient voiced understanding to the plan and had no additional questions.  Risk Prescription drug management.        Final diagnoses:  None    ED Discharge Orders     None          Daralene Lonni JONETTA DEVONNA 10/29/23 1159    Suzette Pac, MD 10/29/23 [phone]

## 2023-10-29 NOTE — ED Triage Notes (Signed)
 Pt reports right side testicle swelling since Thursday with tenderness.

## 2023-10-31 ENCOUNTER — Other Ambulatory Visit: Payer: Self-pay

## 2023-10-31 ENCOUNTER — Emergency Department (HOSPITAL_COMMUNITY)
Admission: EM | Admit: 2023-10-31 | Discharge: 2023-10-31 | Disposition: A | Discharge status: Home / Self Care | Source: Ambulatory Visit | Attending: Emergency Medicine | Admitting: Emergency Medicine

## 2023-10-31 ENCOUNTER — Encounter (HOSPITAL_COMMUNITY): Payer: Self-pay

## 2023-10-31 DIAGNOSIS — Z4801 Encounter for change or removal of surgical wound dressing: Secondary | ICD-10-CM | POA: Insufficient documentation

## 2023-10-31 DIAGNOSIS — Z5189 Encounter for other specified aftercare: Secondary | ICD-10-CM

## 2023-10-31 NOTE — ED Triage Notes (Signed)
 Pt arrived via POV requesting a wound check. Pt seen here Sunday for c/o abscess to scrotum. Pt reports wound was packing in place. Pt denies pain.

## 2023-10-31 NOTE — Discharge Instructions (Signed)
 Please follow-up closely with your primary care doctor on an outpatient basis for continued monitoring of the abscess site.  Return to emergency department immediately for any new or worsening symptoms to include worsening pain, swelling, redness, fevers.  Please continue take all antibiotics as directed.

## 2023-10-31 NOTE — ED Notes (Signed)
 ED Provider at bedside.

## 2023-10-31 NOTE — ED Provider Notes (Signed)
 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   Patient is a 58 year old male who presents emergency department with a chief complaint of a recheck of the abscess to his scrotum.  Patient was evaluated 2 days ago in the emergency department by myself during which time incision and drainage was performed and copious amount of purulent discharge was obtained.  Patient does note that the area is feeling greatly improved at this time with no further drainage.  Patient has had no associated fever, chills, abdominal pain, nausea, vomiting, diarrhea.   Wound Check       Prior to Admission medications   Medication Sig Start Date End Date Taking? Authorizing Provider  amoxicillin -clavulanate (AUGMENTIN ) 875-125 MG tablet Take 1 tablet by mouth every 12 (twelve) hours for 10 days. 10/29/23 11/08/23  Daralene Bruckner D, PA-C  blood glucose meter kit and supplies Dispense based on patient and insurance preference. Check blood glucose 1 time daily before breakfast. 05/05/21   Therisa Benton PARAS, NP  Cholecalciferol (VITAMIN D3) 125 MCG (5000 UT) CAPS Take 1 capsule (5,000 Units total) by mouth daily. 02/14/23   Therisa Benton PARAS, NP  doxycycline  (VIBRAMYCIN ) 100 MG capsule Take 1 capsule (100 mg total) by mouth 2 (two) times daily. 10/29/23   Daralene Bruckner BIRCH, PA-C  fenofibrate  (TRICOR ) 145 MG tablet Take 1 tablet (145 mg total) by mouth daily. 02/14/23   Therisa Benton PARAS, NP  fludrocortisone  (FLORINEF ) 0.1 MG tablet Take 1 tablet (0.1 mg total) by mouth daily. 02/14/23   Therisa Benton PARAS, NP  glipiZIDE  (GLUCOTROL  XL) 5 MG 24 hr tablet Take 1 tablet (5 mg total) by mouth daily with breakfast. 02/14/23   Therisa Benton PARAS, NP  glucose blood test strip Use as instructed 02/14/23   Therisa Benton PARAS, NP  hydrochlorothiazide  (HYDRODIURIL )  25 MG tablet TAKE ONE TABLET (25MG  TOTAL) BY MOUTH DAILY 06/28/23   Therisa Benton PARAS, NP  hydrocortisone  (CORTEF ) 10 MG tablet TAKE ONE AND ONE-HALF TABLET (15MG  TOTAL) EVERY MORNING AND ONE TABLET (10MG  TOTAL) EVERYDAY AT NOON 02/14/23   Therisa Benton PARAS, NP  Lancets Ultra Fine MISC 1 each by Does not apply route 4 (four) times daily. 02/14/23   Therisa Benton PARAS, NP  levothyroxine  (SYNTHROID ) 200 MCG tablet Take 1 tablet (200 mcg total) by mouth daily before breakfast. 02/14/23   Therisa Benton PARAS, NP  levothyroxine  (SYNTHROID ) 25 MCG tablet TAKE ONE TABLET ( TOTAL) BY MOUTH DAILY BEFORE BREAKFAST 06/28/23   Therisa Benton PARAS, NP  lisinopril  (ZESTRIL ) 5 MG tablet TAKE ONE TABLET (5MG  TOTAL) BY MOUTH DAILY 06/28/23   Therisa Benton PARAS, NP  metFORMIN  (GLUCOPHAGE ) 1000 MG tablet Take 1 tablet (1,000 mg total) by mouth 2 (two) times daily with a meal. TAKE ONE TABLET (1000MG ) BY MOUTH TWICE DAILY WITH A MEAL 08/25/23   Therisa Benton PARAS, NP  simvastatin  (ZOCOR ) 20 MG tablet Take 1 tablet (20 mg total) by mouth daily at 6 PM. 02/14/23   Therisa Benton PARAS, NP    Allergies: Bee venom    Review of Systems  Skin:        Abscess  All other systems reviewed and are negative.   Updated Vital Signs BP (!) 133/92 (BP Location: Right Arm)   Pulse 86   Temp 98.2 F (36.8 C) (Oral)  Resp 16   Ht 5' 11 (1.803 m)   Wt 117.9 kg   SpO2 97%   BMI 36.26 kg/m   Physical Exam Vitals and nursing note reviewed.  Constitutional:      Appearance: Normal appearance.  HENT:     Head: Normocephalic and atraumatic.  Eyes:     Extraocular Movements: Extraocular movements intact.     Conjunctiva/sclera: Conjunctivae normal.     Pupils: Pupils are equal, round, and reactive to light.  Cardiovascular:     Rate and Rhythm: Normal rate and regular rhythm.     Pulses: Normal pulses.     Heart sounds: Normal heart sounds.  Pulmonary:     Effort: Pulmonary effort is normal. No respiratory distress.   Abdominal:     General: Abdomen is flat. Bowel sounds are normal.     Palpations: Abdomen is soft.  Musculoskeletal:        General: Normal range of motion.  Skin:    General: Skin is warm and dry.     Comments: Abscess site to scrotum well-healing, erythema greatly improved, no further purulent discharge, packing has came out, no redness, induration, edema noted to perineum  Neurological:     General: No focal deficit present.     Mental Status: He is alert and oriented to person, place, and time. Mental status is at baseline.     (all labs ordered are listed, but only abnormal results are displayed) Labs Reviewed - No data to display  EKG: None  Radiology: No results found.   Procedures   Medications Ordered in the ED - No data to display                                  Medical Decision Making Patient is doing well at this time and is stable for discharge home.  Abscess site is appearing much better at this point and patient notes that symptoms have greatly improved.  Vital signs are stable with no indication for sepsis.  He has no perineal involvement at this time and no indication for Fournier's gangrene or perineal cellulitis.  The packing does appear to have came out.  Area was probed again with no further areas of packing noted within the wound.  No further purulent discharge was noted.  Continue good wound care on outpatient basis was discussed and patient was directed to take all antibiotics as directed.  Close follow-up with primary care doctor was discussed as well.  Strict return precautions were discussed for any new or worsening symptoms.  Patient voiced understanding and had no additional questions        Final diagnoses:  None    ED Discharge Orders     None          Daralene Lonni JONETTA DEVONNA 10/31/23 9076    Towana Ozell BROCKS, MD 10/31/23 [phone]

## 2024-01-02 ENCOUNTER — Ambulatory Visit: Admitting: Nurse Practitioner

## 2024-01-02 ENCOUNTER — Encounter: Payer: Self-pay | Admitting: Nurse Practitioner

## 2024-01-02 VITALS — BP 118/72 | HR 75 | Ht 71.0 in | Wt 270.2 lb

## 2024-01-02 DIAGNOSIS — E782 Mixed hyperlipidemia: Secondary | ICD-10-CM | POA: Diagnosis not present

## 2024-01-02 DIAGNOSIS — E039 Hypothyroidism, unspecified: Secondary | ICD-10-CM

## 2024-01-02 DIAGNOSIS — E2740 Unspecified adrenocortical insufficiency: Secondary | ICD-10-CM

## 2024-01-02 DIAGNOSIS — E559 Vitamin D deficiency, unspecified: Secondary | ICD-10-CM | POA: Diagnosis not present

## 2024-01-02 DIAGNOSIS — Z7984 Long term (current) use of oral hypoglycemic drugs: Secondary | ICD-10-CM

## 2024-01-02 DIAGNOSIS — I10 Essential (primary) hypertension: Secondary | ICD-10-CM

## 2024-01-02 DIAGNOSIS — E1165 Type 2 diabetes mellitus with hyperglycemia: Secondary | ICD-10-CM

## 2024-01-02 MED ORDER — LEVOTHYROXINE SODIUM 25 MCG PO TABS: 25.0000 ug | ORAL_TABLET | Freq: Every day | ORAL | 3 refills | Status: AC

## 2024-01-02 MED ORDER — HYDROCORTISONE 10 MG PO TABS: ORAL_TABLET | ORAL | 3 refills | Status: AC

## 2024-01-02 MED ORDER — LISINOPRIL 5 MG PO TABS: 5.0000 mg | ORAL_TABLET | Freq: Every day | ORAL | 3 refills | Status: AC

## 2024-01-02 MED ORDER — LEVOTHYROXINE SODIUM 200 MCG PO TABS: 200.0000 ug | ORAL_TABLET | Freq: Every day | ORAL | 3 refills | Status: AC

## 2024-01-02 MED ORDER — FENOFIBRATE 145 MG PO TABS: 145.0000 mg | ORAL_TABLET | Freq: Every day | ORAL | 3 refills | Status: AC

## 2024-01-02 MED ORDER — METFORMIN HCL 1000 MG PO TABS: 1000.0000 mg | ORAL_TABLET | Freq: Two times a day (BID) | ORAL | 3 refills | Status: AC

## 2024-01-02 MED ORDER — HYDROCHLOROTHIAZIDE 25 MG PO TABS: 25.0000 mg | ORAL_TABLET | Freq: Every day | ORAL | 3 refills | Status: AC

## 2024-01-02 MED ORDER — SIMVASTATIN 20 MG PO TABS: 20.0000 mg | ORAL_TABLET | Freq: Every day | ORAL | 3 refills | Status: AC

## 2024-01-02 MED ORDER — FLUDROCORTISONE ACETATE 0.1 MG PO TABS: 0.1000 mg | ORAL_TABLET | Freq: Every day | ORAL | 3 refills | Status: AC

## 2024-01-02 MED ORDER — VITAMIN D3 125 MCG (5000 UT) PO CAPS: 5000.0000 [IU] | ORAL_CAPSULE | Freq: Every day | ORAL | 3 refills | Status: AC

## 2024-01-02 MED ORDER — GLIPIZIDE ER 5 MG PO TB24: 5.0000 mg | ORAL_TABLET | Freq: Every day | ORAL | 3 refills | Status: AC

## 2024-01-02 NOTE — Progress Notes (Signed)
 01/02/2024, 6:37 PM   Endocrinology follow-up note   Subjective:    Patient ID: Jeffery Daniels, male    DOB: 1965/09/10.  Jeffery Daniels is being seen in follow-up for management of currently uncontrolled type 2 diabetes, adrenal insufficiency, hypothyroidism, hyperlipidemia, hypertension.    PMD:   Marvine Rush, MD.   Past Medical History:  Diagnosis Date   Addison disease [facility])    Hyperlipidemia    Past Surgical History:  Procedure Laterality Date   HERNIA REPAIR     Social History   Socioeconomic History   Marital status: Single    Spouse name: Not on file   Number of children: Not on file   Years of education: Not on file   Highest education level: Not on file  Occupational History   Not on file  Tobacco Use   Smoking status: Every Day    Current packs/day: 1.50    Average packs/day: 1.5 packs/day for 25.0 years (37.5 ttl pk-yrs)    Types: Cigarettes   Smokeless tobacco: Never  Vaping Use   Vaping status: Never Used  Substance and Sexual Activity   Alcohol use: Not Currently    Comment: Occasional   Drug use: Never   Sexual activity: Never  Other Topics Concern   Not on file  Social History Narrative   Not on file   Social Drivers of Health   Financial Resource Strain: Not on file  Food Insecurity: Not on file  Transportation Needs: Not on file  Physical Activity: Not on file  Stress: Not on file  Social Connections: Not on file   Outpatient Encounter Medications as of 01/02/2024  Medication Sig   blood glucose meter kit and supplies Dispense based on patient and insurance preference. Check blood glucose 1 time daily before breakfast.   glucose blood test strip Use as instructed   Lancets Ultra Fine MISC 1 each by Does not apply route 4 (four) times daily.   [DISCONTINUED] Cholecalciferol (VITAMIN D3) 125 MCG (5000 UT) CAPS Take 1 capsule (5,000 Units total) by mouth daily.   [DISCONTINUED] doxycycline  (VIBRAMYCIN ) 100 MG  capsule Take 1 capsule (100 mg total) by mouth 2 (two) times daily.   [DISCONTINUED] fenofibrate  (TRICOR ) 145 MG tablet Take 1 tablet (145 mg total) by mouth daily.   [DISCONTINUED] fludrocortisone  (FLORINEF ) 0.1 MG tablet Take 1 tablet (0.1 mg total) by mouth daily.   [DISCONTINUED] glipiZIDE  (GLUCOTROL  XL) 5 MG 24 hr tablet Take 1 tablet (5 mg total) by mouth daily with breakfast.   [DISCONTINUED] hydrochlorothiazide  (HYDRODIURIL ) 25 MG tablet TAKE ONE TABLET (25MG  TOTAL) BY MOUTH DAILY   [DISCONTINUED] hydrocortisone  (CORTEF ) 10 MG tablet TAKE ONE AND ONE-HALF TABLET (15MG  TOTAL) EVERY MORNING AND ONE TABLET (10MG  TOTAL) EVERYDAY AT NOON   [DISCONTINUED] levothyroxine  (SYNTHROID ) 200 MCG tablet Take 1 tablet (200 mcg total) by mouth daily before breakfast.   [DISCONTINUED] levothyroxine  (SYNTHROID ) 25 MCG tablet TAKE ONE TABLET ( TOTAL) BY MOUTH DAILY BEFORE BREAKFAST   [DISCONTINUED] lisinopril  (ZESTRIL ) 5 MG tablet TAKE ONE TABLET (5MG  TOTAL) BY MOUTH DAILY   [DISCONTINUED] metFORMIN  (GLUCOPHAGE ) 1000 MG tablet Take 1 tablet (1,000 mg total) by mouth 2 (two) times daily with a meal. TAKE ONE TABLET (1000MG ) BY MOUTH TWICE DAILY WITH A MEAL   [DISCONTINUED] simvastatin  (ZOCOR ) 20 MG tablet Take 1 tablet (20 mg total) by mouth daily at 6 PM.   Cholecalciferol (VITAMIN D3) 125 MCG (5000 UT) CAPS Take 1  capsule (5,000 Units total) by mouth daily.   fenofibrate  (TRICOR ) 145 MG tablet Take 1 tablet (145 mg total) by mouth daily.   fludrocortisone  (FLORINEF ) 0.1 MG tablet Take 1 tablet (0.1 mg total) by mouth daily.   glipiZIDE  (GLUCOTROL  XL) 5 MG 24 hr tablet Take 1 tablet (5 mg total) by mouth daily with breakfast.   hydrochlorothiazide  (HYDRODIURIL ) 25 MG tablet Take 1 tablet (25 mg total) by mouth daily.   hydrocortisone  (CORTEF ) 10 MG tablet TAKE ONE AND ONE-HALF TABLET (15MG  TOTAL) EVERY MORNING AND ONE TABLET (10MG  TOTAL) EVERYDAY AT NOON   levothyroxine  (SYNTHROID ) 200 MCG tablet Take 1  tablet (200 mcg total) by mouth daily before breakfast.   levothyroxine  (SYNTHROID ) 25 MCG tablet Take 1 tablet (25 mcg total) by mouth daily before breakfast.   lisinopril  (ZESTRIL ) 5 MG tablet Take 1 tablet (5 mg total) by mouth daily. TAKE ONE TABLET (5MG  TOTAL) BY MOUTH DAILY   metFORMIN  (GLUCOPHAGE ) 1000 MG tablet Take 1 tablet (1,000 mg total) by mouth 2 (two) times daily with a meal. TAKE ONE TABLET (1000MG ) BY MOUTH TWICE DAILY WITH A MEAL   simvastatin  (ZOCOR ) 20 MG tablet Take 1 tablet (20 mg total) by mouth daily at 6 PM.   No facility-administered encounter medications on file as of 01/02/2024.    ALLERGIES: Allergies  Allergen Reactions   Bee Venom     VACCINATION STATUS:  There is no immunization history on file for this patient.  Diabetes He presents for his follow-up diabetic visit. He has type 2 diabetes mellitus. Onset time: He diagnosed at age of 15 years. His disease course has been improving. There are no hypoglycemic associated symptoms. Pertinent negatives for hypoglycemia include no headaches, nervousness/anxiousness, seizures or tremors. Pertinent negatives for diabetes include no blurred vision, no chest pain, no fatigue, no foot ulcerations, no polydipsia, no polyuria and no weight loss. There are no hypoglycemic complications. Symptoms are stable. There are no diabetic complications. Risk factors for coronary artery disease include diabetes mellitus, dyslipidemia, male sex, obesity, hypertension, sedentary lifestyle, tobacco exposure and family history. Current diabetic treatment includes oral agent (dual therapy). He is compliant with treatment most of the time. His weight is increasing steadily. He is following a generally unhealthy diet. When asked about meal planning, he reported none. He has not had a previous visit with a dietitian. He rarely participates in exercise. His overall blood glucose range is 140-180 mg/dl. (He presents today with his meter showing  inconsistent glucose monitoring and above target glycemic profile overall.  His POCT A1c today is 7.7%, improving from last visit of 8.4%.   Analysis of his meter shows 30-day average of 180.  He notes he did run out of strips for his meter.  He denies any hypoglycemia.) An ACE inhibitor/angiotensin II receptor blocker is not being taken. He does not see a podiatrist.Eye exam is current.  Hyperlipidemia This is a chronic problem. The current episode started more than 1 year ago. The problem is uncontrolled. Recent lipid tests were reviewed and are variable. Exacerbating diseases include diabetes, hypothyroidism and obesity. Factors aggravating his hyperlipidemia include smoking and thiazides. Pertinent negatives include no chest pain, myalgias or shortness of breath. Current antihyperlipidemic treatment includes statins. The current treatment provides mild improvement of lipids. Compliance problems include adherence to diet and adherence to exercise.  Risk factors for coronary artery disease include dyslipidemia, diabetes mellitus, family history, obesity, male sex, hypertension and a sedentary lifestyle.  Hypertension This is a chronic problem.  The current episode started more than 1 year ago. The problem has been gradually improving since onset. The problem is uncontrolled. Pertinent negatives include no blurred vision, chest pain, headaches, palpitations or shortness of breath. Agents associated with hypertension include thyroid  hormones and steroids. Risk factors for coronary artery disease include diabetes mellitus, dyslipidemia, family history, obesity, male gender, sedentary lifestyle and smoking/tobacco exposure. Past treatments include diuretics. The current treatment provides mild improvement. There are no compliance problems.  Identifiable causes of hypertension include a hypertension causing med and a thyroid  problem.  Thyroid  Problem Presents for follow-up visit. Symptoms include weight gain.  Patient reports no anxiety, cold intolerance, constipation, diarrhea, fatigue, heat intolerance, palpitations, tremors or weight loss. The symptoms have been stable. His past medical history is significant for diabetes and hyperlipidemia.    Review of systems  Constitutional: + increasing body weight,  current Body mass index is 37.69 kg/m. , no fatigue, no subjective hyperthermia, no subjective hypothermia Eyes: no blurry vision, no xerophthalmia ENT: no sore throat, no nodules palpated in throat, no dysphagia/odynophagia, no hoarseness Cardiovascular: no chest pain, no shortness of breath, no palpitations, no leg swelling Respiratory: no cough, no shortness of breath Gastrointestinal: no nausea/vomiting/diarrhea Musculoskeletal: no muscle/joint aches Skin: no rashes, no hyperemia Neurological: no tremors, no numbness, no tingling, no dizziness Psychiatric: no depression, no anxiety   Objective:    BP 118/72 (BP Location: Left Arm, Patient Position: Sitting, Cuff Size: Large)   Pulse 75   Ht 5' 11 (1.803 m)   Wt 270 lb 3.2 oz (122.6 kg)   BMI 37.69 kg/m   Wt Readings from Last 3 Encounters:  01/02/24 270 lb 3.2 oz (122.6 kg)  10/31/23 260 lb (117.9 kg)  10/29/23 260 lb (117.9 kg)     BP Readings from Last 3 Encounters:  01/02/24 118/72  10/31/23 (!) 134/90  10/29/23 (!) 123/96     Physical Exam- Limited  Constitutional:  Body mass index is 37.69 kg/m. , not in acute distress, normal state of mind Eyes:  EOMI, no exophthalmos Musculoskeletal: no gross deformities, strength intact in all four extremities, no gross restriction of joint movements Skin:  no rashes, no hyperemia Neurological: no tremor with outstretched hands   Diabetic Foot Exam - Simple   Simple Foot Form Diabetic Foot exam was performed with the following findings: Yes 01/02/2024  4:24 PM  Visual Inspection No deformities, no ulcerations, no other skin breakdown bilaterally: Yes Sensation  Testing Intact to touch and monofilament testing bilaterally: Yes Pulse Check Posterior Tibialis and Dorsalis pulse intact bilaterally: Yes Comments     No results found for this or any previous visit (from the past 2160 hours).  Lipid Panel     Component Value Date/Time   CHOL 187 08/11/2023 0904   TRIG 435 (H) 08/11/2023 0904   HDL 40 08/11/2023 0904   CHOLHDL 4.7 08/11/2023 0904   LDLCALC 78 08/11/2023 0904     Assessment & Plan:   1) Uncontrolled type 2 diabetes mellitus with hyperglycemia   - Jeffery Daniels has currently controlled asymptomatic type 2 DM since 58 years of age.  He presents today with his meter showing inconsistent glucose monitoring and above target glycemic profile overall.  His POCT A1c today is 7.7%, improving from last visit of 8.4%.   Analysis of his meter shows 30-day average of 180.  He notes he did run out of strips for his meter.  He denies any hypoglycemia.  -his diabetes is complicated by adrenal  insufficiency on hydrocortisone , hypothyroidism, obesity/sedentary life, chronic heavy smoking and Jeffery Daniels remains at a high risk for more acute and chronic complications which include CAD, CVA, CKD, retinopathy, and neuropathy. These are all discussed in detail with the patient.  - Nutritional counseling repeated at each appointment due to patients tendency to fall back in to old habits.  - The patient admits there is a room for improvement in their diet and drink choices. -  Suggestion is made for the patient to avoid simple carbohydrates from their diet including Cakes, Sweet Desserts / Pastries, Ice Cream, Soda (diet and regular), Sweet Tea, Candies, Chips, Cookies, Sweet Pastries, Store Bought Juices, Alcohol in Excess of 1-2 drinks a day, Artificial Sweeteners, Coffee Creamer, and Sugar-free Products. This will help patient to have stable blood glucose profile and potentially avoid unintended weight gain.   - I encouraged the patient to  switch to unprocessed or minimally processed complex starch and increased protein intake (animal or plant source), fruits, and vegetables.   - Patient is advised to stick to a routine mealtimes to eat 3 meals a day and avoid unnecessary snacks (to snack only to correct hypoglycemia).  - I have approached him with the following individualized plan to manage diabetes and patient agrees:   -He is advised to continue Metformin  1000 mg p.o. twice daily with meals and continue Glipizide  5 mg XL daily with breakfast.  We did talk about adding on another agent if he cannot regain control with his current regimen.  He will benefit most from diet modification, is eating lots of fast food which is detrimental to his cardiovascular system as well.   -He is encouraged to continue monitoring blood glucose consistently, at least once daily, before breakfast, and call the clinic if he has readings less than 70 or greater than 200 for 3 tests in a row.  - he continues to smoke heavily, not a suitable candidate for incretin therapy.  If he continues to lose control of diabetes, he will be considered for basal insulin.   - Patient specific target  A1c;  LDL, HDL, Triglycerides, were discussed in detail.  2) BP/HTN:  His blood pressure is controlled to target.  He is advised to continue HCTZ 25 mg po daily and Lisinopril  5 mg po daily (refills sent in today).  3) Lipids/HPL:  His most recent lipid panel from 08/11/23 shows controlled LDL of 78 and elevated triglycerides of 435.   He is advised to continue Simvastatin  20 mg po daily at bedtime and Fenofibrate  145 mg po daily at bedtime.  Side effects and precautions discussed with him.    4)  Weight/Diet:  His Body mass index is 37.69 kg/m.-clearly complicating his diabetes care.  He has lost 11 lbs since last visit.  He is a candidate for modest weight loss.  CDE Consult has been  initiated , exercise, and detailed carbohydrates information provided.  5) Adrenal  insufficiency (HCC) - The circumstances of his diagnosis 25 years ago are not available available to review. He reports Addison's Disease.  -He is on a stable dose of steroids.  He is advised to continue his current steroid replacement therapy including Hydrocortisone  15 mg p.o. daily in the morning, and 10 mg daily at noon. He is advised to continue Fludrocortisone  0.1mg  po daily.   -He is advised not to discontinue his steroid replacement without first discussing it with his providers.  6) Hypothyroidism -There are no recent TFTs to review.  He is  advised to continue Levothyroxine  225 mcg po daily before breakfast.  Will recheck prior to next visit and adjust dose accordingly.     - We discussed about the correct intake of his thyroid  hormone, on empty stomach at fasting, with water, separated by at least 30 minutes from breakfast and other medications,  and separated by more than 4 hours from calcium, iron, multivitamins, acid reflux medications (PPIs). -Patient is made aware of the fact that thyroid  hormone replacement is needed for life, dose to be adjusted by periodic monitoring of thyroid  function tests.  7) Vitamin D  deficiency His most recent vitamin D  level was 33.2 on 04/29/22.  He has taken OTC Vitamin D3 5000 units daily in the past but is not currently taking any.  Will recheck vitamin d  prior to visit.  8) Chronic Care/Health Maintenance: -he is not on ACEI/ARB, is on Statin medications and is encouraged to continue to follow up with Ophthalmology, Dentist, Podiatrist at least yearly or according to recommendations, and advised to Quit smoking. I have recommended yearly flu vaccine and pneumonia vaccination at least every 5 years; moderate intensity exercise for up to 150 minutes weekly; and  sleep for at least 7 hours a day.  The patient was counseled on the dangers of tobacco use, and was advised to quit.  Reviewed strategies to maximize success, including removing cigarettes and  smoking materials from environment.  - I advised patient to maintain close follow up with Marvine Rush, MD for primary care needs.     I spent  32  minutes in the care of the patient today including review of labs from CMP, Lipids, Thyroid  Function, Hematology (current and previous including abstractions from other facilities); face-to-face time discussing  his blood glucose readings/logs, discussing hypoglycemia and hyperglycemia episodes and symptoms, medications doses, his options of short and long term treatment based on the latest standards of care / guidelines;  discussion about incorporating lifestyle medicine;  and documenting the encounter. Risk reduction counseling performed per USPSTF guidelines to reduce obesity and cardiovascular risk factors.     Please refer to Patient Instructions for Blood Glucose Monitoring and Insulin/Medications Dosing Guide  in media tab for additional information. Please  also refer to  Patient Self Inventory in the Media  tab for reviewed elements of pertinent patient history.  Jeffery Daniels participated in the discussions, expressed understanding, and voiced agreement with the above plans.  All questions were answered to his satisfaction. he is encouraged to contact clinic should he have any questions or concerns prior to his return visit.   Follow up plan: - Return in about 4 months (around 05/03/2024) for Diabetes F/U with A1c in office, Previsit labs, Bring meter and logs.  Benton Rio, [facility] [address] [place], [place] [address] Phone: [phone] Fax: [phone]  01/02/2024, 6:37 PM

## 2024-05-06 ENCOUNTER — Ambulatory Visit: Admitting: Nurse Practitioner

## 2024-05-20 ENCOUNTER — Ambulatory Visit: Admitting: Nurse Practitioner

## 2024-06-04 ENCOUNTER — Ambulatory Visit: Admitting: Nurse Practitioner
